# Patient Record
Sex: Female | Born: 1985 | Race: Black or African American | Hispanic: No | Marital: Single | Smoking: Current some day smoker
Health system: Southern US, Community
[De-identification: ages and names within clinical notes are randomized; demographics above are authoritative.]

## PROBLEM LIST (undated history)

## (undated) DIAGNOSIS — F4310 Post-traumatic stress disorder, unspecified: Secondary | ICD-10-CM

## (undated) DIAGNOSIS — J45909 Unspecified asthma, uncomplicated: Secondary | ICD-10-CM

## (undated) DIAGNOSIS — F319 Bipolar disorder, unspecified: Secondary | ICD-10-CM

## (undated) HISTORY — PX: WISDOM TOOTH EXTRACTION: SHX21

---
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
## 2005-04-09 ENCOUNTER — Ambulatory Visit (HOSPITAL_COMMUNITY): Admission: RE | Admit: 2005-04-09 | Discharge: 2005-04-09 | Payer: Self-pay | Admitting: Obstetrics & Gynecology

## 2005-06-25 ENCOUNTER — Ambulatory Visit (HOSPITAL_COMMUNITY): Admission: RE | Admit: 2005-06-25 | Discharge: 2005-06-25 | Payer: Self-pay | Admitting: Obstetrics & Gynecology

## 2005-07-05 ENCOUNTER — Inpatient Hospital Stay (HOSPITAL_COMMUNITY): Admission: AD | Admit: 2005-07-05 | Discharge: 2005-07-05 | Payer: Self-pay | Admitting: Obstetrics & Gynecology

## 2005-07-10 ENCOUNTER — Inpatient Hospital Stay (HOSPITAL_COMMUNITY): Admission: AD | Admit: 2005-07-10 | Discharge: 2005-07-10 | Payer: Self-pay | Admitting: Obstetrics

## 2005-08-25 ENCOUNTER — Inpatient Hospital Stay (HOSPITAL_COMMUNITY): Admission: AD | Admit: 2005-08-25 | Discharge: 2005-08-25 | Payer: Self-pay | Admitting: Obstetrics & Gynecology

## 2005-09-10 ENCOUNTER — Inpatient Hospital Stay (HOSPITAL_COMMUNITY): Admission: RE | Admit: 2005-09-10 | Discharge: 2005-09-13 | Payer: Self-pay | Admitting: Obstetrics

## 2006-05-19 ENCOUNTER — Ambulatory Visit (HOSPITAL_COMMUNITY): Admission: RE | Admit: 2006-05-19 | Discharge: 2006-05-19 | Payer: Self-pay | Admitting: Obstetrics & Gynecology

## 2006-05-26 ENCOUNTER — Inpatient Hospital Stay (HOSPITAL_COMMUNITY): Admission: AD | Admit: 2006-05-26 | Discharge: 2006-05-30 | Payer: Self-pay | Admitting: Obstetrics

## 2006-07-07 ENCOUNTER — Inpatient Hospital Stay (HOSPITAL_COMMUNITY): Admission: AD | Admit: 2006-07-07 | Discharge: 2006-07-07 | Payer: Self-pay | Admitting: Obstetrics & Gynecology

## 2006-07-24 ENCOUNTER — Ambulatory Visit (HOSPITAL_COMMUNITY): Admission: RE | Admit: 2006-07-24 | Discharge: 2006-07-24 | Payer: Self-pay | Admitting: Obstetrics & Gynecology

## 2006-08-27 ENCOUNTER — Inpatient Hospital Stay (HOSPITAL_COMMUNITY): Admission: AD | Admit: 2006-08-27 | Discharge: 2006-08-27 | Payer: Self-pay | Admitting: Obstetrics

## 2006-09-28 ENCOUNTER — Inpatient Hospital Stay (HOSPITAL_COMMUNITY): Admission: AD | Admit: 2006-09-28 | Discharge: 2006-10-01 | Payer: Self-pay | Admitting: Obstetrics

## 2007-04-02 ENCOUNTER — Inpatient Hospital Stay (HOSPITAL_COMMUNITY): Admission: AD | Admit: 2007-04-02 | Discharge: 2007-04-02 | Payer: Self-pay | Admitting: Obstetrics & Gynecology

## 2007-12-12 ENCOUNTER — Emergency Department (HOSPITAL_COMMUNITY): Admission: EM | Admit: 2007-12-12 | Discharge: 2007-12-12 | Payer: Self-pay | Admitting: Family Medicine

## 2008-06-12 ENCOUNTER — Emergency Department (HOSPITAL_COMMUNITY): Admission: EM | Admit: 2008-06-12 | Discharge: 2008-06-12 | Payer: Self-pay | Admitting: Family Medicine

## 2008-06-26 ENCOUNTER — Emergency Department (HOSPITAL_COMMUNITY): Admission: EM | Admit: 2008-06-26 | Discharge: 2008-06-26 | Payer: Self-pay | Admitting: Emergency Medicine

## 2008-10-23 ENCOUNTER — Emergency Department (HOSPITAL_COMMUNITY): Admission: EM | Admit: 2008-10-23 | Discharge: 2008-10-23 | Payer: Self-pay | Admitting: Emergency Medicine

## 2008-11-14 ENCOUNTER — Emergency Department (HOSPITAL_COMMUNITY): Admission: EM | Admit: 2008-11-14 | Discharge: 2008-11-15 | Payer: Self-pay | Admitting: Emergency Medicine

## 2010-03-24 ENCOUNTER — Encounter: Payer: Self-pay | Admitting: Obstetrics & Gynecology

## 2010-06-12 LAB — RPR: RPR Ser Ql: NONREACTIVE

## 2010-06-12 LAB — POCT URINALYSIS DIP (DEVICE)
Glucose, UA: NEGATIVE mg/dL
Ketones, ur: NEGATIVE mg/dL
Specific Gravity, Urine: 1.02 (ref 1.005–1.030)
pH: 7 (ref 5.0–8.0)

## 2010-06-12 LAB — GC/CHLAMYDIA PROBE AMP, URINE: GC Probe Amp, Urine: NEGATIVE

## 2010-06-12 LAB — POCT PREGNANCY, URINE: Preg Test, Ur: NEGATIVE

## 2010-07-19 NOTE — Discharge Summary (Signed)
NAMECYERRA, YIM             ACCOUNT NO.:  [id number]   MEDICAL RECORD NO.:  [id number]          PATIENT TYPE:  INP   LOCATION:  9118                          FACILITY:  [facility]   PHYSICIAN:  Roseanna Rainbow, M.D.DATE OF BIRTH:  01-18-1986   DATE OF ADMISSION:  05/26/2006  DATE OF DISCHARGE:  05/30/2006                               DISCHARGE SUMMARY   CHIEF COMPLAINT:  The patient is a 25 year old African American female  with an intrauterine pregnancy at 21+ weeks complaining of fever,  chills, nausea and vomiting.   HISTORY OF PRESENT ILLNESS:  Please see the above.  She had initially  presented to the [facility] Emergency Department.  A chest x-ray was  negative.   PAST SURGICAL HISTORY:  She denies.   PAST MEDICAL HISTORY:  Asthma.   MEDICATIONS:  Prenatal vitamins, albuterol, Benadryl, and Tylenol.   ALLERGIES:  NO KNOWN DRUG ALLERGIES.   SOCIAL HISTORY:  She is single.  She denies any tobacco, ethanol, or  drug use.   PHYSICAL EXAMINATION:  VITAL SIGNS:  Temperature 102.3.  LUNGS:  Bilateral wheezes.  HEART:  Regular rate and rhythm.  ABDOMEN:  Nontender.  PELVIC:  Deferred.   LABORATORY WORK:  Urinalysis:  Specific gravity greater than 1.030,  positive nitrites, trace leukocyte esterase, moderate ketones.  White  blood cell count 5.7, hemoglobin 11.  Basic metabolic profile normal.   ASSESSMENT:  1. Intrauterine pregnancy at 21+ weeks.  2. Viral upper respiratory tract infection.  3. Dehydration.   PLAN:  Admission parenteral antibiotics, supportive management, check a  urine culture and sensitivity.   HOSPITAL COURSE:  The patient was admitted, parenteral antibiotic  therapy was initiated.  On hospital day #1, she complained of difficulty  swallowing and a sore throat.  An exam on hospital day #2, of the  oropharynx, there was bilateral enlarged tonsils noted.  There was an  exudate noted on the right tonsil.  A phone consultation was obtained  with Dr. Suszanne Conners from ENT, who recommended Decadron and clindamycin.  Her  symptoms improved.  A urine culture and sensitivity with no discrete  uropathogen was noted.  She was then discharged to home on May 30, 2006.   DISCHARGE DIAGNOSIS:  1,  Viral upper respiratory tract infection.  1. Tonsillitis.  2. Intrauterine pregnancy at 21+ weeks.   CONDITION:  Stable.   DIET:  Regular.   ACTIVITY:  Modified bed rest.   MEDICATIONS:  Included clindamycin   DISPOSITION:  She was to call Dr. Suszanne Conners of ENT for an appointment, and  she was to call to follow up in the office in 1 week.      Roseanna Rainbow, M.D.  Electronically Signed     LAJ/MEDQ  D:  07/03/2006  T:  07/03/2006  Job:  [id number]

## 2010-11-21 LAB — URINALYSIS, ROUTINE W REFLEX MICROSCOPIC
Glucose, UA: NEGATIVE
Urobilinogen, UA: 0.2

## 2010-11-21 LAB — POCT PREGNANCY, URINE: Operator id: 280921

## 2010-11-21 LAB — URINE MICROSCOPIC-ADD ON

## 2010-12-16 LAB — CBC
HCT: 30.5 — ABNORMAL LOW
HCT: 36.7
Hemoglobin: 10.1 — ABNORMAL LOW
MCHC: 32.6
MCV: 84.5
MCV: 84.9
RBC: 3.6 — ABNORMAL LOW
RDW: 13.6
RDW: 13.7
WBC: 7
WBC: 9.2

## 2011-08-12 ENCOUNTER — Emergency Department: Payer: Self-pay | Admitting: *Deleted

## 2011-08-12 LAB — URINALYSIS, COMPLETE
Bilirubin,UR: NEGATIVE
Leukocyte Esterase: NEGATIVE
Nitrite: NEGATIVE
Ph: 5 (ref 4.5–8.0)
RBC,UR: 4 /HPF (ref 0–5)
Specific Gravity: 1.03 (ref 1.003–1.030)
Squamous Epithelial: 1

## 2011-08-12 LAB — COMPREHENSIVE METABOLIC PANEL
Albumin: 3.5 g/dL (ref 3.4–5.0)
Alkaline Phosphatase: 58 U/L (ref 50–136)
Anion Gap: 9 (ref 7–16)
BUN: 10 mg/dL (ref 7–18)
Calcium, Total: 8.2 mg/dL — ABNORMAL LOW (ref 8.5–10.1)
Chloride: 107 mmol/L (ref 98–107)
Co2: 22 mmol/L (ref 21–32)
Creatinine: 0.81 mg/dL (ref 0.60–1.30)
EGFR (African American): >60
EGFR (Non-African Amer.): >60
Osmolality: 276 (ref 275–301)
Potassium: 3.4 mmol/L — ABNORMAL LOW (ref 3.5–5.1)
SGOT(AST): 17 U/L (ref 15–37)
SGPT (ALT): 21 U/L
Sodium: 138 mmol/L (ref 136–145)
Total Protein: 7 g/dL (ref 6.4–8.2)

## 2011-08-12 LAB — CBC
HCT: 36.4 % (ref 35.0–47.0)
MCH: 28.1 pg (ref 26.0–34.0)
MCHC: 32.4 g/dL (ref 32.0–36.0)
RBC: 4.2 10*6/uL (ref 3.80–5.20)
WBC: 6.2 10*3/uL (ref 3.6–11.0)

## 2011-08-12 LAB — WET PREP, GENITAL

## 2011-08-12 LAB — HCG, QUANTITATIVE, PREGNANCY: Beta Hcg, Quant.: 1122 m[IU]/mL — ABNORMAL HIGH

## 2011-08-12 LAB — PREGNANCY, URINE: Pregnancy Test, Urine: POSITIVE m[IU]/mL

## 2011-09-05 ENCOUNTER — Emergency Department: Payer: Self-pay | Admitting: *Deleted

## 2011-09-05 LAB — CBC
MCH: 27.9 pg (ref 26.0–34.0)
MCHC: 32.1 g/dL (ref 32.0–36.0)
Platelet: 208 10*3/uL (ref 150–440)
WBC: 4.8 10*3/uL (ref 3.6–11.0)

## 2011-09-05 LAB — URINALYSIS, COMPLETE
Bilirubin,UR: NEGATIVE
Glucose,UR: NEGATIVE mg/dL (ref 0–75)
Nitrite: NEGATIVE
Protein: NEGATIVE
RBC,UR: 5 /HPF (ref 0–5)
Squamous Epithelial: 12
WBC UR: 24 /HPF (ref 0–5)

## 2011-09-05 LAB — HCG, QUANTITATIVE, PREGNANCY: Beta Hcg, Quant.: 4627 m[IU]/mL — ABNORMAL HIGH

## 2012-03-28 ENCOUNTER — Emergency Department: Payer: Self-pay | Admitting: Unknown Physician Specialty

## 2012-07-24 ENCOUNTER — Emergency Department: Payer: Self-pay | Admitting: Emergency Medicine

## 2012-07-24 LAB — URINALYSIS, COMPLETE
Bacteria: NONE SEEN
Glucose,UR: NEGATIVE mg/dL (ref 0–75)
Ph: 7 (ref 4.5–8.0)
Protein: NEGATIVE
Specific Gravity: 1.026 (ref 1.003–1.030)
WBC UR: 7 /HPF (ref 0–5)

## 2012-07-24 LAB — GC/CHLAMYDIA PROBE AMP

## 2013-02-23 ENCOUNTER — Emergency Department (HOSPITAL_COMMUNITY)
Admission: EM | Admit: 2013-02-23 | Discharge: 2013-02-24 | Disposition: A | Discharge status: Home / Self Care | Payer: Self-pay | Attending: Emergency Medicine | Admitting: Emergency Medicine

## 2013-02-23 ENCOUNTER — Encounter (HOSPITAL_COMMUNITY): Payer: Self-pay | Admitting: Emergency Medicine

## 2013-02-23 DIAGNOSIS — N760 Acute vaginitis: Secondary | ICD-10-CM | POA: Insufficient documentation

## 2013-02-23 DIAGNOSIS — B9689 Other specified bacterial agents as the cause of diseases classified elsewhere: Secondary | ICD-10-CM | POA: Insufficient documentation

## 2013-02-23 DIAGNOSIS — Z3202 Encounter for pregnancy test, result negative: Secondary | ICD-10-CM | POA: Insufficient documentation

## 2013-02-23 DIAGNOSIS — A499 Bacterial infection, unspecified: Secondary | ICD-10-CM | POA: Insufficient documentation

## 2013-02-23 DIAGNOSIS — N390 Urinary tract infection, site not specified: Secondary | ICD-10-CM | POA: Insufficient documentation

## 2013-02-23 LAB — URINALYSIS, ROUTINE W REFLEX MICROSCOPIC
Ketones, ur: NEGATIVE mg/dL
Urobilinogen, UA: 1 mg/dL (ref 0.0–1.0)

## 2013-02-23 LAB — URINE MICROSCOPIC-ADD ON

## 2013-02-23 NOTE — ED Notes (Signed)
Pt st;s she has had vaginal discharge with itching x's 2 months.  St's she was seen for same and given antibiotic but did not get it filled  Pt denies any pain

## 2013-02-24 MED ORDER — METRONIDAZOLE 500 MG PO TABS: 500.0000 mg | ORAL_TABLET | Freq: Two times a day (BID) | ORAL | Status: DC

## 2013-02-24 MED ORDER — CEPHALEXIN 500 MG PO CAPS: 500.0000 mg | ORAL_CAPSULE | Freq: Four times a day (QID) | ORAL | Status: DC

## 2013-02-24 NOTE — ED Provider Notes (Signed)
CSN: [id number]     Arrival date & time 02/23/13  2034 History   First MD Initiated Contact with Patient 02/23/13 2350     Chief Complaint  Patient presents with  . Vaginal Discharge   (Consider location/radiation/quality/duration/timing/severity/associated sxs/prior Treatment) HPI Patient is a generally healthy woman in her 89s into says that she was seen in the emergency department 3 weeks ago for vaginal itching with a mucoid vaginal discharge. She was diagnosed with bacterial vaginosis, after pelvic exam. She was prescribed metronidazole. However, she says that in the interim, she lost her job, became homeless and has not had the prescription filled. She requests a second copy of the prescription along with any financial assistance available to her to have her prescription filled.  She continues to have mucoid vaginal discharge with vaginal itching. No abdominal pain, dysuria, fever.  History reviewed. No pertinent past medical history. History reviewed. No pertinent past surgical history. No family history on file. History  Substance Use Topics  . Smoking status: Not on file  . Smokeless tobacco: Not on file  . Alcohol Use: Not on file   OB History   Grav Para Term Preterm Abortions TAB SAB Ect Mult Living                 Review of Systems 10 point ROS performed and is negative with the exception of sx noted above.   Allergies  Review of patient's allergies indicates not on file.  Home Medications  No current outpatient prescriptions on file. BP 106/69  Pulse 95  Temp(Src) 98.4 F (36.9 C) (Oral)  Resp 16  SpO2 98% Physical Exam Gen: well developed and well nourished appearing Head: NCAT Eyes: PERL, EOMI Nose: no epistaixis or rhinorrhea Mouth/throat: mucosa is moist and pink Neck: supple, no stridor Lungs: CTA B, no wheezing, rhonchi or rales CV: RRR, no murmur, extremities appear well perfused Abd: soft, notender, nondistended Back: no ttp, no cva ttp,  normal to inspection Skin: warm and dry Ext: no edema, normal to inspection Neuro: CN ii-xii grossly intact, no focal deficits Psyche; normal affect,  calm and cooperative.   ED Course  Procedures (including critical care time) Labs Review  Results for orders placed during the hospital encounter of 02/23/13 (from the past 24 hour(s))  URINALYSIS, ROUTINE W REFLEX MICROSCOPIC     Status: Abnormal   Collection Time    02/23/13 10:32 PM      Result Value Range   Color, Urine YELLOW  YELLOW   APPearance TURBID (*) CLEAR   Specific Gravity, Urine 1.026  1.005 - 1.030   pH 6.5  5.0 - 8.0   Glucose, UA NEGATIVE  NEGATIVE mg/dL   Hgb urine dipstick MODERATE (*) NEGATIVE   Bilirubin Urine NEGATIVE  NEGATIVE   Ketones, ur NEGATIVE  NEGATIVE mg/dL   Protein, ur 30 (*) NEGATIVE mg/dL   Urobilinogen, UA 1.0  0.0 - 1.0 mg/dL   Nitrite NEGATIVE  NEGATIVE   Leukocytes, UA LARGE (*) NEGATIVE  URINE MICROSCOPIC-ADD ON     Status: Abnormal   Collection Time    02/23/13 10:32 PM      Result Value Range   Squamous Epithelial / LPF MANY (*) RARE   WBC, UA TOO NUMEROUS TO COUNT  <3 WBC/hpf   RBC / HPF 21-50  <3 RBC/hpf   Bacteria, UA MANY (*) RARE   Urine-Other MODERATE TRICHOMONAS    POCT PREGNANCY, URINE     Status: None   Collection Time  02/23/13 10:38 PM      Result Value Range   Preg Test, Ur NEGATIVE  NEGATIVE     MDM  Patient with untreated bacterial vaginosis. We will give script for Flagyl which is on $4 list at [facility]. Will see if case management can be of assistance with cost. The patient has an incidental finding of UTI with contaminated specimen. We will tx with Keflex po.  Patient referred to [facility] for outpatient f/u.     Brandt Loosen, MD 02/24/13 [phone]

## 2013-03-21 ENCOUNTER — Encounter (HOSPITAL_COMMUNITY): Payer: Self-pay | Admitting: Emergency Medicine

## 2013-03-21 ENCOUNTER — Emergency Department (HOSPITAL_COMMUNITY)
Admission: EM | Admit: 2013-03-21 | Discharge: 2013-03-22 | Disposition: A | Discharge status: Home / Self Care | Payer: Self-pay | Attending: Emergency Medicine | Admitting: Emergency Medicine

## 2013-03-21 DIAGNOSIS — R102 Pelvic and perineal pain: Secondary | ICD-10-CM

## 2013-03-21 DIAGNOSIS — F17200 Nicotine dependence, unspecified, uncomplicated: Secondary | ICD-10-CM | POA: Insufficient documentation

## 2013-03-21 DIAGNOSIS — Z79899 Other long term (current) drug therapy: Secondary | ICD-10-CM | POA: Insufficient documentation

## 2013-03-21 DIAGNOSIS — F319 Bipolar disorder, unspecified: Secondary | ICD-10-CM | POA: Insufficient documentation

## 2013-03-21 DIAGNOSIS — J45909 Unspecified asthma, uncomplicated: Secondary | ICD-10-CM | POA: Insufficient documentation

## 2013-03-21 DIAGNOSIS — R109 Unspecified abdominal pain: Secondary | ICD-10-CM | POA: Insufficient documentation

## 2013-03-21 DIAGNOSIS — F4310 Post-traumatic stress disorder, unspecified: Secondary | ICD-10-CM | POA: Insufficient documentation

## 2013-03-21 DIAGNOSIS — Z882 Allergy status to sulfonamides status: Secondary | ICD-10-CM | POA: Insufficient documentation

## 2013-03-21 DIAGNOSIS — R110 Nausea: Secondary | ICD-10-CM | POA: Insufficient documentation

## 2013-03-21 DIAGNOSIS — N72 Inflammatory disease of cervix uteri: Secondary | ICD-10-CM | POA: Insufficient documentation

## 2013-03-21 DIAGNOSIS — Z8619 Personal history of other infectious and parasitic diseases: Secondary | ICD-10-CM | POA: Insufficient documentation

## 2013-03-21 DIAGNOSIS — Z3202 Encounter for pregnancy test, result negative: Secondary | ICD-10-CM | POA: Insufficient documentation

## 2013-03-21 HISTORY — DX: Post-traumatic stress disorder, unspecified: F43.10

## 2013-03-21 HISTORY — DX: Unspecified asthma, uncomplicated: J45.909

## 2013-03-21 HISTORY — DX: Bipolar disorder, unspecified: F31.9

## 2013-03-21 LAB — CBC WITH DIFFERENTIAL/PLATELET
BASOS ABS: 0 10*3/uL (ref 0.0–0.1)
BASOS PCT: 0 % (ref 0–1)
EOS ABS: 0.3 10*3/uL (ref 0.0–0.7)
Eosinophils Relative: 5 % (ref 0–5)
HCT: 39.4 % (ref 36.0–46.0)
Hemoglobin: 12.9 g/dL (ref 12.0–15.0)
Lymphocytes Relative: 44 % (ref 12–46)
Lymphs Abs: 2.4 10*3/uL (ref 0.7–4.0)
MCH: 28.1 pg (ref 26.0–34.0)
MCHC: 32.7 g/dL (ref 30.0–36.0)
MCV: 85.8 fL (ref 78.0–100.0)
MONOS PCT: 8 % (ref 3–12)
Monocytes Absolute: 0.4 10*3/uL (ref 0.1–1.0)
NEUTROS ABS: 2.4 10*3/uL (ref 1.7–7.7)
Neutrophils Relative %: 43 % (ref 43–77)
PLATELETS: 233 10*3/uL (ref 150–400)
RBC: 4.59 MIL/uL (ref 3.87–5.11)
RDW: 13.5 % (ref 11.5–15.5)
WBC: 5.5 10*3/uL (ref 4.0–10.5)

## 2013-03-21 LAB — COMPREHENSIVE METABOLIC PANEL
ALBUMIN: 3.5 g/dL (ref 3.5–5.2)
ALK PHOS: 54 U/L (ref 39–117)
ALT: 19 U/L (ref 0–35)
AST: 18 U/L (ref 0–37)
BILIRUBIN TOTAL: 0.2 mg/dL — AB (ref 0.3–1.2)
BUN: 10 mg/dL (ref 6–23)
CHLORIDE: 107 meq/L (ref 96–112)
CO2: 23 mEq/L (ref 19–32)
Calcium: 8.7 mg/dL (ref 8.4–10.5)
Creatinine, Ser: 0.58 mg/dL (ref 0.50–1.10)
GFR calc Af Amer: >90 mL/min (ref >90)
GFR calc non Af Amer: >90 mL/min (ref >90)
Glucose, Bld: 101 mg/dL — ABNORMAL HIGH (ref 70–99)
POTASSIUM: 4 meq/L (ref 3.7–5.3)
Sodium: 141 mEq/L (ref 137–147)
TOTAL PROTEIN: 7 g/dL (ref 6.0–8.3)

## 2013-03-21 LAB — LIPASE, BLOOD: LIPASE: 43 U/L (ref 11–59)

## 2013-03-21 LAB — POCT PREGNANCY, URINE: Preg Test, Ur: NEGATIVE

## 2013-03-21 NOTE — ED Notes (Signed)
Patients states unable to void at this time.  Patient is aware of need for UA.

## 2013-03-21 NOTE — ED Notes (Signed)
Pt in via EMS to triage, c/o right flank pain and lower right abd pain x2 days, worse today, nausea with diarrhea, unknown if she is pregnant

## 2013-03-21 NOTE — ED Notes (Signed)
Pt with bil lower back pain that radiates to R and L lower abdomen.  Denies urinary s/s or vaginal d/c but c/o nausea and diarrhea.

## 2013-03-22 LAB — URINALYSIS, ROUTINE W REFLEX MICROSCOPIC
BILIRUBIN URINE: NEGATIVE
GLUCOSE, UA: NEGATIVE mg/dL
Ketones, ur: NEGATIVE mg/dL
Nitrite: NEGATIVE
PH: 5.5 (ref 5.0–8.0)
Protein, ur: NEGATIVE mg/dL
SPECIFIC GRAVITY, URINE: 1.021 (ref 1.005–1.030)
Urobilinogen, UA: 0.2 mg/dL (ref 0.0–1.0)

## 2013-03-22 LAB — WET PREP, GENITAL
CLUE CELLS WET PREP: NONE SEEN
TRICH WET PREP: NONE SEEN
YEAST WET PREP: NONE SEEN

## 2013-03-22 LAB — GC/CHLAMYDIA PROBE AMP
CT Probe RNA: NEGATIVE
GC Probe RNA: NEGATIVE

## 2013-03-22 LAB — URINE MICROSCOPIC-ADD ON

## 2013-03-22 MED ORDER — AZITHROMYCIN 1 G PO PACK
1.0000 g | PACK | Freq: Once | ORAL | Status: AC
  Administered 2013-03-22: 1 g via ORAL
  Filled 2013-03-22: qty 1

## 2013-03-22 MED ORDER — CEFTRIAXONE SODIUM 250 MG IJ SOLR
250.0000 mg | Freq: Once | INTRAMUSCULAR | Status: AC
  Administered 2013-03-22: 250 mg via INTRAMUSCULAR
  Filled 2013-03-22: qty 250

## 2013-03-22 MED ORDER — ACETAMINOPHEN 500 MG PO TABS
1000.0000 mg | ORAL_TABLET | Freq: Once | ORAL | Status: AC
  Administered 2013-03-22: 1000 mg via ORAL
  Filled 2013-03-22: qty 2

## 2013-03-22 MED ORDER — LIDOCAINE HCL (PF) 1 % IJ SOLN
INTRAMUSCULAR | Status: AC
  Administered 2013-03-22: 2.1 mL
  Filled 2013-03-22: qty 5

## 2013-03-22 NOTE — ED Provider Notes (Signed)
CSN: [id number]     Arrival date & time 03/21/13  1906 History   First MD Initiated Contact with Patient 03/22/13 0025     Chief Complaint  Patient presents with  . Back Pain  . Abdominal Pain   (Consider location/radiation/quality/duration/timing/severity/associated sxs/prior Treatment) Patient is a 28 y.o. female presenting with back pain and abdominal pain.  Back Pain Location:  Lumbar spine (now spread to suprapubic abdominal pain) Quality:  Aching Radiates to:  Does not radiate Pain severity:  Moderate Onset quality:  Gradual Duration:  3 days Timing:  Constant Progression:  Worsening Chronicity:  New Relieved by:  Nothing Worsened by:  Nothing tried Associated symptoms: abdominal pain   Associated symptoms: no chest pain, no dysuria and no fever   Associated symptoms comment:  No constipation, no diarrhea Abdominal Pain Pain location:  Suprapubic Associated symptoms: nausea   Associated symptoms: no chest pain, no cough, no diarrhea, no dysuria, no fever, no shortness of breath, no vaginal bleeding, no vaginal discharge and no vomiting     Past Medical History  Diagnosis Date  . Asthma   . PTSD (post-traumatic stress disorder)   . Bipolar 1 disorder    History reviewed. No pertinent past surgical history. No family history on file. History  Substance Use Topics  . Smoking status: Current Some Day Smoker    Types: Cigarettes  . Smokeless tobacco: Not on file  . Alcohol Use: No   OB History   Grav Para Term Preterm Abortions TAB SAB Ect Mult Living                 Review of Systems  Constitutional: Negative for fever.  HENT: Negative for congestion.   Respiratory: Negative for cough and shortness of breath.   Cardiovascular: Negative for chest pain.  Gastrointestinal: Positive for nausea and abdominal pain. Negative for vomiting and diarrhea.  Genitourinary: Negative for dysuria, vaginal bleeding and vaginal discharge.  Musculoskeletal: Positive for back  pain.  All other systems reviewed and are negative.    Allergies  Sulfa antibiotics  Home Medications   Current Outpatient Rx  Name  Route  Sig  Dispense  Refill  . escitalopram (LEXAPRO) 10 MG tablet   Oral   Take 10 mg by mouth daily.         Marland Kitchen. ibuprofen (ADVIL,MOTRIN) 200 MG tablet   Oral   Take 200 mg by mouth every 6 (six) hours as needed.         . lurasidone (LATUDA) 40 MG TABS tablet   Oral   Take 40 mg by mouth daily with breakfast.         . cephALEXin (KEFLEX) 500 MG capsule   Oral   Take 1 capsule (500 mg total) by mouth 4 (four) times daily.   28 capsule   0   . metroNIDAZOLE (FLAGYL) 500 MG tablet   Oral   Take 1 tablet (500 mg total) by mouth 2 (two) times daily. One po bid x 7 days   14 tablet   0    BP 117/52  Pulse 87  Temp(Src) 98.1 F (36.7 C) (Oral)  Resp 16  Ht 5' 10.5" (1.791 m)  Wt 315 lb 4.1 oz (143 kg)  BMI 44.58 kg/m2  SpO2 100%  LMP 02/23/2013 Physical Exam  Nursing note and vitals reviewed. Constitutional: She is oriented to person, place, and time. She appears well-developed and well-nourished. No distress.  obese  HENT:  Head: Normocephalic and atraumatic.  Mouth/Throat: Oropharynx is clear and moist.  Eyes: Conjunctivae are normal. Pupils are equal, round, and reactive to light. No scleral icterus.  Neck: Neck supple.  Cardiovascular: Normal rate, regular rhythm, normal heart sounds and intact distal pulses.   No murmur heard. Pulmonary/Chest: Effort normal and breath sounds normal. No stridor. No respiratory distress. She has no rales.  Abdominal: Soft. Bowel sounds are normal. She exhibits no distension. There is tenderness in the suprapubic area. There is no rigidity, no rebound, no guarding and no CVA tenderness.  Genitourinary: Cervix exhibits discharge (white) and friability. Cervix exhibits no motion tenderness. Right adnexum displays no mass. Left adnexum displays no mass.  Mild nonfocal pelvic tenderness   Musculoskeletal: Normal range of motion.       Lumbar back: She exhibits no tenderness.  Neurological: She is alert and oriented to person, place, and time.  Skin: Skin is warm and dry. No rash noted.  Psychiatric: She has a normal mood and affect. Her behavior is normal.    ED Course  Procedures (including critical care time) Labs Review Labs Reviewed  WET PREP, GENITAL - Abnormal; Notable for the following:    WBC, Wet Prep HPF POC MANY (*)    All other components within normal limits  COMPREHENSIVE METABOLIC PANEL - Abnormal; Notable for the following:    Glucose, Bld 101 (*)    Total Bilirubin 0.2 (*)    All other components within normal limits  URINALYSIS, ROUTINE W REFLEX MICROSCOPIC - Abnormal; Notable for the following:    APPearance CLOUDY (*)    Hgb urine dipstick TRACE (*)    Leukocytes, UA SMALL (*)    All other components within normal limits  URINE MICROSCOPIC-ADD ON - Abnormal; Notable for the following:    Squamous Epithelial / LPF MANY (*)    Bacteria, UA FEW (*)    All other components within normal limits  URINE CULTURE  GC/CHLAMYDIA PROBE AMP  CBC WITH DIFFERENTIAL  LIPASE, BLOOD  POCT PREGNANCY, URINE   Imaging Review No results found.  EKG Interpretation   None       MDM   1. Suprapubic abdominal pain   2. Cervicitis    28 yo female with low back pain, now suprapubic abd pain.  No urinary symptoms.  No vaginal bleeding or discharge.  Recently finished course of Flagyl for BV.  Urine without evidence of infection.  Trace blood, but symptoms inconsistent with urinary stone.  Labwork otherwise unremarkable.  Plan pelvic exam.    Pelvic notable for cervical friability and mild tenderness.  Exam not c/w torsion, TOA, appendicitis, bowel obstruction.  Plan to treat for cervicitis presumptively.  Pt has hx of STD, as well.  Rocephin IM and azithro ordered.    Candyce Churn, MD 03/22/13 [phone]

## 2013-03-22 NOTE — Discharge Instructions (Signed)
Abdominal Pain, Women °Abdominal (stomach, pelvic, or belly) pain can be caused by many things. It is important to tell your doctor: °· The location of the pain. °· Does it come and go or is it present all the time? °· Are there things that start the pain (eating certain foods, exercise)? °· Are there other symptoms associated with the pain (fever, nausea, vomiting, diarrhea)? °All of this is helpful to know when trying to find the cause of the pain. °CAUSES  °· Stomach: virus or bacteria infection, or ulcer. °· Intestine: appendicitis (inflamed appendix), regional ileitis (Crohn's disease), ulcerative colitis (inflamed colon), irritable bowel syndrome, diverticulitis (inflamed diverticulum of the colon), or cancer of the stomach or intestine. °· Gallbladder disease or stones in the gallbladder. °· Kidney disease, kidney stones, or infection. °· Pancreas infection or cancer. °· Fibromyalgia (pain disorder). °· Diseases of the female organs: °· Uterus: fibroid (non-cancerous) tumors or infection. °· Fallopian tubes: infection or tubal pregnancy. °· Ovary: cysts or tumors. °· Pelvic adhesions (scar tissue). °· Endometriosis (uterus lining tissue growing in the pelvis and on the pelvic organs). °· Pelvic congestion syndrome (female organs filling up with blood just before the menstrual period). °· Pain with the menstrual period. °· Pain with ovulation (producing an egg). °· Pain with an IUD (intrauterine device, birth control) in the uterus. °· Cancer of the female organs. °· Functional pain (pain not caused by a disease, may improve without treatment). °· Psychological pain. °· Depression. °DIAGNOSIS  °Your doctor will decide the seriousness of your pain by doing an examination. °· Blood tests. °· X-rays. °· Ultrasound. °· CT scan (computed tomography, special type of X-ray). °· MRI (magnetic resonance imaging). °· Cultures, for infection. °· Barium enema (dye inserted in the large intestine, to better view it with  X-rays). °· Colonoscopy (looking in intestine with a lighted tube). °· Laparoscopy (minor surgery, looking in abdomen with a lighted tube). °· Major abdominal exploratory surgery (looking in abdomen with a large incision). °TREATMENT  °The treatment will depend on the cause of the pain.  °· Many cases can be observed and treated at home. °· Over-the-counter medicines recommended by your caregiver. °· Prescription medicine. °· Antibiotics, for infection. °· Birth control pills, for painful periods or for ovulation pain. °· Hormone treatment, for endometriosis. °· Nerve blocking injections. °· Physical therapy. °· Antidepressants. °· Counseling with a psychologist or psychiatrist. °· Minor or major surgery. °HOME CARE INSTRUCTIONS  °· Do not take laxatives, unless directed by your caregiver. °· Take over-the-counter pain medicine only if ordered by your caregiver. Do not take aspirin because it can cause an upset stomach or bleeding. °· Try a clear liquid diet (broth or water) as ordered by your caregiver. Slowly move to a bland diet, as tolerated, if the pain is related to the stomach or intestine. °· Have a thermometer and take your temperature several times a day, and record it. °· Bed rest and sleep, if it helps the pain. °· Avoid sexual intercourse, if it causes pain. °· Avoid stressful situations. °· Keep your follow-up appointments and tests, as your caregiver orders. °· If the pain does not go away with medicine or surgery, you may try: °· Acupuncture. °· Relaxation exercises (yoga, meditation). °· Group therapy. °· Counseling. °SEEK MEDICAL CARE IF:  °· You notice certain foods cause stomach pain. °· Your home care treatment is not helping your pain. °· You need stronger pain medicine. °· You want your IUD removed. °· You feel faint or   lightheaded.  You develop nausea and vomiting.  You develop a rash.  You are having side effects or an allergy to your medicine. SEEK IMMEDIATE MEDICAL CARE IF:   Your  pain does not go away or gets worse.  You have a fever.  Your pain is felt only in portions of the abdomen. The right side could possibly be appendicitis. The left lower portion of the abdomen could be colitis or diverticulitis.  You are passing blood in your stools (bright red or black tarry stools, with or without vomiting).  You have blood in your urine.  You develop chills, with or without a fever.  You pass out. MAKE SURE YOU:   Understand these instructions.  Will watch your condition.  Will get help right away if you are not doing well or get worse. Document Released: 12/15/2006 Document Revised: 05/12/2011 Document Reviewed: 01/04/2009 [facility] Patient Information 2014 [place], [place].  Cervicitis Cervicitis is a soreness and swelling (inflammation) of the cervix. Your cervix is located at the bottom of your uterus. It opens up to the vagina. CAUSES   Sexually transmitted infections (STIs).   Allergic reaction.   Medicines or birth control devices that are put in the vagina.   Injury to the cervix.   Bacterial infections.  RISK FACTORS You are at greater risk if you:  Have unprotected sexual intercourse.  Have sexual intercourse with many partners.  Began sexual intercourse at an early age.  Have a history of STIs. SYMPTOMS  There may be no symptoms. If symptoms occur, they may include:   Grey, white, yellow, or bad-smelling vaginal discharge.   Pain or itching of the area outside the vagina.   Painful sexual intercourse.   Lower abdominal or lower back pain, especially during intercourse.   Frequent urination.   Abnormal vaginal bleeding between periods, after sexual intercourse, or after menopause.   Pressure or a heavy feeling in the pelvis.  DIAGNOSIS  Diagnosis is made after a pelvic exam. Other tests may include:   Examination of any discharge under a microscope (wet prep).   A Pap test.  TREATMENT  Treatment will depend  on the cause of cervicitis. If it is caused by an STI, both you and your partner will need to be treated. Antibiotic medicines will be given.  HOME CARE INSTRUCTIONS   Do not have sexual intercourse until your health care provider says it is okay.   Do not have sexual intercourse until your partner has been treated, if your cervicitis is caused by an STI.   Take your antibiotics as directed. Finish them even if you start to feel better.  SEEK MEDICAL CARE IF:  Your symptoms come back.   You have a fever.  MAKE SURE YOU:   Understand these instructions.  Will watch your condition.  Will get help right away if you are not doing well or get worse. Document Released: 02/17/2005 Document Revised: 10/20/2012 Document Reviewed: 08/11/2012 [facility] Patient Information 2014 [place], [place].   Emergency Department Resource Guide 1) Find a Doctor and Pay Out of Pocket Although you won't have to find out who is covered by your insurance plan, it is a good idea to ask around and get recommendations. You will then need to call the office and see if the doctor you have chosen will accept you as a new patient and what types of options they offer for patients who are self-pay. Some doctors offer discounts or will set up payment plans for their patients  who do not have insurance, but you will need to ask so you aren't surprised when you get to your appointment.  2) Contact Your Local Health Department Not all health departments have doctors that can see patients for sick visits, but many do, so it is worth a call to see if yours does. If you don't know where your local health department is, you can check in your phone book. The CDC also has a tool to help you locate your state's health department, and many state websites also have listings of all of their local health departments.  3) Find a Walk-in Clinic If your illness is not likely to be very severe or complicated, you may want to try a walk  in clinic. These are popping up all over the country in pharmacies, drugstores, and shopping centers. They're usually staffed by nurse practitioners or physician assistants that have been trained to treat common illnesses and complaints. They're usually fairly quick and inexpensive. However, if you have serious medical issues or chronic medical problems, these are probably not your best option.  No Primary Care Doctor: - Call Health Connect at  [phone] - they can help you locate a primary care doctor that  accepts your insurance, provides certain services, etc. - Physician Referral Service- [phone]  Chronic Pain Problems: Organization         Address  Phone   Notes  [facility]  [phone] Patients need to be referred by their primary care doctor.   Medication Assistance: Organization         Address  Phone   Notes  [facility] Medication [facility] [address] [place] [place], [place] [address] [phone] --Must be a resident of [facility] -- Must have NO insurance coverage whatsoever (no Medicaid/ Medicare, etc.) -- The pt. MUST have a primary care doctor that directs their care regularly and follows them in the community   [facility]  [phone]   [employer]  [phone]    Agencies that provide inexpensive medical care: Organization         Address  Phone   Notes  [facility]  [phone]   [facility]    [phone]   [facility] [address] [place], [place] [address] [phone]   [facility] [address] [place] [place] [phone]   Planned Parenthood    [phone]   [facility]    [phone]   [facility]  [address], [place] Phone:  [phone], Fax:  [phone] Hours of Operation:  9 am - 6 pm, M-F.  Also accepts Medicaid/Medicare and self-pay.  [facility]  [address], [place] Phone: [phone], Fax: [phone]. Hours of Operation:  8:30 am - 5:30 pm, M-F.  Also accepts Medicaid and self-pay.  [facility] [place] [address] [place] Phone: [phone]   [facility] [address] Natasha Bence [place], [place] [phone], Ext. [phone] Mondays & Thursdays: 7-9 AM.  First 15 patients are seen on a first come, first serve basis.    Medicaid-accepting [employer] [facility] Providers:  Organization         Address  Phone   Notes  [facility] [address], [place] [phone] Also accepts self-pay patients.  [facility] [address] Laurell Josephs [place], [place]  [phone]   [facility] [address], [place] [phone]   [facility] Family Medicine [address], [place] [phone]   Renaye Rakers [address], [place]   [phone] Only accepts [place] patients after they have their name applied to their card.   Self-Pay (no insurance) in [facility]:  Organization         Address  Phone   Notes  Sickle Cell Patients, [facility] [address] [place], [place] [phone]   [facility] [address] [place], [place] [phone]   [facility] [place]  [address], [place] [phone]   [facility]/Dr. Osei-Bonsu  [address] [place] or [address], [place] [phone] Phone number for both [place] and [place] locations is the same.  [facility]gent Medical and [facility] [address] [place] [phone]   [facility] [address] [place] or [address] [phone]   [facility] [address], [place] [phone], phone; [phone], fax Sees  patients 1st and 3rd Saturday of every month.  Must not qualify for public or private insurance (i.e. Medicaid, Medicare, [place] Health Choice, Veterans' Benefits)  Household income should be no more than 200% of the poverty level The clinic cannot treat you if you are pregnant or think you are pregnant  Sexually transmitted diseases are not treated at the clinic.    Dental Care: Organization         Address  Phone  Notes  [facility] Department of [facility] [address] [place], [place] [phone] Accepts children up to age 81 who are enrolled in [place] or [place] Health Choice; pregnant women with a Medicaid card; and children who have applied for Medicaid or [place] Health Choice, but were declined, whose parents can pay a reduced fee at time of service.  [facility] Department of [facility]  [address], [place] [phone] Accepts children up to age 5 who are enrolled in [place] or [place] Health Choice; pregnant women with a Medicaid card; and children who have applied for Medicaid or [place] Health Choice, but were declined, whose parents can pay a reduced fee at time of service.  [facility]  [address] [place], [place] [phone] Patients are seen by appointment only. Walk-ins are not accepted. [facility] will see patients 31 years of age and older. Monday - Tuesday (8am-5pm) Most Wednesdays (8:30-5pm) $30 per visit, cash only  [facility] Adult Dental Access PROGRAM  [address], [facility] [phone] Patients are seen by appointment only. Walk-ins are not accepted. [facility] will see patients 3 years of age and older. One Wednesday Evening (Monthly: Volunteer Based).  $30 per visit, cash only  [employer]  [phone] for adults; Children under age 37, call Graduate Pediatric Dentistry at [phone]. Children aged 66-14, please call [phone] to request a  pediatric application.  Dental services are provided in all areas of dental care including fillings, crowns and bridges, complete and partial dentures, implants, gum treatment, root canals, and extractions. Preventive care is also provided. Treatment is provided to both adults and children.  Patients are selected via a lottery and there is often a waiting list.   [facility] [address], [place]  [phone] [email or web address]   [facility] [address], [place], [place] [phone], Ext. [phone] Second and Fourth Thursday of each month, opens at 6:30 AM; Clinic ends at 9 AM.  Patients are seen on a first-come first-served basis, and a limited number are seen during each clinic.   [facility]  [address], [place], [place] [phone]   Eligibility Requirements You must have lived in [place], [place], or [place] for at least the last three months.   You cannot be eligible for state or federal sponsored [employer] Cityhealthcare insurance, including CIG[employer]eterans Administration, [place]icaid, or [employer]icare.   You generally cannot be eligible for healthcare insurance through your employer.    How to apply: Eligibility screenings are held every Tuesday and Wednesday afternoon from 1:00 pm until 4:00 pm. You do not need an appointment for the interview!  [facility] [address], [place], [place] [phone]   [facility]  [phone]   [facility]  [phone]   [facility]  [phone]    Behavioral Health Resources in the Community: Intensive Outpatient Programs Organization         Address  Phone  Notes  [facility] [address], [place], [place] [phone]   [facility] [address], [place], [place] [phone]   [facility] [address], [place], [place]  [phone]   [facility] [address],  [place], [place] [phone] or [phone]   Substance Abuse Resources Organization         Address  Phone  Notes  [facility]  [phone]   [facility]  [phone]   [facility]  [phone]   [facility]  [phone]   Residential & Outpatient Substance Abuse Program  [phone]   Psychological Services Organization         Address  Phone  Notes  [facility]  [phone]- [phone]   [facility]  [phone]- [phone]   [facility] [address], [place] [phone] or [phone]    Mobile Crisis Teams Organization         Address  Phone  Notes  [facility], Mobile Crisis Care Unit  [phone]   [facility]  [address] [place], [place] [phone]   Doristine LocksSharon DeEsch [address] [place] [phone]    Self-Help/Support Groups Organization         Address  Phone             Notes  [facility] [place] - variety of support groups  [phone] Call for more information  Narcotics Anonymous (NA), [facility] [address], [place]  2 meetings at this location   Statisticianesidential Treatment Programs Organization         Address  Phone  Notes  [facility] [address],    [place]  [phone]   [facility]  [address], [place] [phone], [place], [place] [phone]   [facility] [address], [place] [phone] Admissions: 8am-3pm M-F  [facility] [address]    [place], [place] [phone]   [facility] [phone]  [address] Leonard Schwartz [place], [place] [phone]   [facility] [address]  [place], [place] [phone]   [facility] - Intensive Outpatient [address] Laurell Josephs [address], [place], [place] [phone]   [facility] ([facility] [address] [place]    [place], [place] [phone] or [phone]   [facility] ([facility]) [address], [place], [place] [phone] Accepts Medicaid  [facility] [address],  [place] [phone] Substance Abuse/Addiction Treatment   [facility] Organization         Address  Phone  Notes  [facility]  [phone]   Angie Fava, PhD [address] Ervin Knack [place], [place]   [phone] or [phone]   [facility] Behavioral   [address] [place], [place] [phone]   [facility] [address] [place], [place] [phone] Insurance/Medicaid/sponsorship through [facility] and Families [address]                                    [employer], [place] [phone] Therapy/tele-psych/case  [facility] [address], [place] [phone]    Dr. Lolly Mustache  [phone]   [facility]  [employer] [facility] Dept. 1) [address] [place] 2) [address] [place] 3)  [address], [place] [phone]  [phone]   [facility] Child Abuse Hotline (402)067-0014 or [phone] (After Hours)

## 2013-03-23 LAB — URINE CULTURE: Colony Count: 75000

## 2015-08-09 ENCOUNTER — Emergency Department: Payer: Self-pay

## 2015-08-09 ENCOUNTER — Emergency Department
Admission: EM | Admit: 2015-08-09 | Discharge: 2015-08-09 | Disposition: A | Discharge status: Home / Self Care | Payer: Self-pay | Attending: Emergency Medicine | Admitting: Emergency Medicine

## 2015-08-09 ENCOUNTER — Encounter: Payer: Self-pay | Admitting: *Deleted

## 2015-08-09 DIAGNOSIS — J45909 Unspecified asthma, uncomplicated: Secondary | ICD-10-CM | POA: Insufficient documentation

## 2015-08-09 DIAGNOSIS — N83202 Unspecified ovarian cyst, left side: Secondary | ICD-10-CM

## 2015-08-09 DIAGNOSIS — N3090 Cystitis, unspecified without hematuria: Secondary | ICD-10-CM

## 2015-08-09 DIAGNOSIS — F319 Bipolar disorder, unspecified: Secondary | ICD-10-CM | POA: Insufficient documentation

## 2015-08-09 DIAGNOSIS — Z791 Long term (current) use of non-steroidal anti-inflammatories (NSAID): Secondary | ICD-10-CM | POA: Insufficient documentation

## 2015-08-09 DIAGNOSIS — F17210 Nicotine dependence, cigarettes, uncomplicated: Secondary | ICD-10-CM | POA: Insufficient documentation

## 2015-08-09 DIAGNOSIS — R112 Nausea with vomiting, unspecified: Secondary | ICD-10-CM | POA: Insufficient documentation

## 2015-08-09 LAB — URINALYSIS COMPLETE WITH MICROSCOPIC (ARMC ONLY)
BILIRUBIN URINE: NEGATIVE
GLUCOSE, UA: NEGATIVE mg/dL
KETONES UR: NEGATIVE mg/dL
LEUKOCYTES UA: NEGATIVE
NITRITE: NEGATIVE
Protein, ur: NEGATIVE mg/dL
SPECIFIC GRAVITY, URINE: 1.021 (ref 1.005–1.030)
pH: 6 (ref 5.0–8.0)

## 2015-08-09 LAB — WET PREP, GENITAL
Clue Cells Wet Prep HPF POC: NONE SEEN
Sperm: NONE SEEN
TRICH WET PREP: NONE SEEN
Yeast Wet Prep HPF POC: NONE SEEN

## 2015-08-09 LAB — CBC WITH DIFFERENTIAL/PLATELET
BASOS ABS: 0 10*3/uL (ref 0–0.1)
BASOS PCT: 1 %
EOS ABS: 0.2 10*3/uL (ref 0–0.7)
EOS PCT: 3 %
HCT: 37.9 % (ref 35.0–47.0)
Hemoglobin: 12.3 g/dL (ref 12.0–16.0)
LYMPHS PCT: 35 %
Lymphs Abs: 1.9 10*3/uL (ref 1.0–3.6)
MCH: 28.2 pg (ref 26.0–34.0)
MCHC: 32.6 g/dL (ref 32.0–36.0)
MCV: 86.6 fL (ref 80.0–100.0)
MONO ABS: 0.5 10*3/uL (ref 0.2–0.9)
Monocytes Relative: 8 %
Neutro Abs: 3 10*3/uL (ref 1.4–6.5)
Neutrophils Relative %: 53 %
PLATELETS: 196 10*3/uL (ref 150–440)
RBC: 4.37 MIL/uL (ref 3.80–5.20)
RDW: 13.4 % (ref 11.5–14.5)
WBC: 5.7 10*3/uL (ref 3.6–11.0)

## 2015-08-09 LAB — COMPREHENSIVE METABOLIC PANEL
ALBUMIN: 3.9 g/dL (ref 3.5–5.0)
ALK PHOS: 45 U/L (ref 38–126)
ALT: 13 U/L — ABNORMAL LOW (ref 14–54)
ANION GAP: 7 (ref 5–15)
AST: 15 U/L (ref 15–41)
BUN: 9 mg/dL (ref 6–20)
CALCIUM: 8.8 mg/dL — AB (ref 8.9–10.3)
CHLORIDE: 108 mmol/L (ref 101–111)
CO2: 21 mmol/L — AB (ref 22–32)
Creatinine, Ser: 0.61 mg/dL (ref 0.44–1.00)
GFR calc non Af Amer: >60 mL/min (ref >60)
Glucose, Bld: 88 mg/dL (ref 65–99)
Potassium: 4.3 mmol/L (ref 3.5–5.1)
Sodium: 136 mmol/L (ref 135–145)
Total Bilirubin: 0.2 mg/dL — ABNORMAL LOW (ref 0.3–1.2)
Total Protein: 6.4 g/dL — ABNORMAL LOW (ref 6.5–8.1)

## 2015-08-09 LAB — LIPASE, BLOOD: Lipase: 25 U/L (ref 11–51)

## 2015-08-09 LAB — POCT PREGNANCY, URINE: Preg Test, Ur: NEGATIVE

## 2015-08-09 LAB — CHLAMYDIA/NGC RT PCR (ARMC ONLY)
CHLAMYDIA TR: NOT DETECTED
N gonorrhoeae: NOT DETECTED

## 2015-08-09 LAB — PREGNANCY, URINE: Preg Test, Ur: NEGATIVE

## 2015-08-09 MED ORDER — IBUPROFEN 800 MG PO TABS
800.0000 mg | ORAL_TABLET | Freq: Once | ORAL | Status: AC
  Administered 2015-08-09: 800 mg via ORAL

## 2015-08-09 MED ORDER — IBUPROFEN 800 MG PO TABS: 800.0000 mg | ORAL_TABLET | Freq: Three times a day (TID) | ORAL | Status: DC | PRN

## 2015-08-09 MED ORDER — IBUPROFEN 800 MG PO TABS
ORAL_TABLET | ORAL | Status: AC
  Administered 2015-08-09: 800 mg via ORAL
  Filled 2015-08-09: qty 1

## 2015-08-09 MED ORDER — DIATRIZOATE MEGLUMINE & SODIUM 66-10 % PO SOLN
15.0000 mL | Freq: Once | ORAL | Status: AC
  Administered 2015-08-09: 15 mL via ORAL

## 2015-08-09 MED ORDER — CIPROFLOXACIN HCL 250 MG PO TABS: 250.0000 mg | ORAL_TABLET | Freq: Two times a day (BID) | ORAL | Status: DC

## 2015-08-09 MED ORDER — MORPHINE SULFATE (PF) 4 MG/ML IV SOLN
4.0000 mg | Freq: Once | INTRAVENOUS | Status: AC
  Administered 2015-08-09: 4 mg via INTRAVENOUS
  Filled 2015-08-09: qty 1

## 2015-08-09 MED ORDER — IOPAMIDOL (ISOVUE-300) INJECTION 61%
125.0000 mL | Freq: Once | INTRAVENOUS | Status: AC | PRN
  Administered 2015-08-09: 125 mL via INTRAVENOUS

## 2015-08-09 NOTE — ED Notes (Signed)
Pt complains of right lower abdominal pain with nausea and vomiting for 1 week, pt reports having blood tinged vomit, pt is in no distress  During triage

## 2015-08-09 NOTE — ED Provider Notes (Signed)
[facility] Emergency Department Provider Note        Time seen: ----------------------------------------- 11:07 AM on 08/09/2015 -----------------------------------------    I have reviewed the triage vital signs and the nursing notes.   HISTORY  Chief Complaint Abdominal Pain    HPI Jennifer Jackson is a 30 y.o. female who presents ER with lower abdominal pain associated with nausea vomiting for the last week. Patient reports having some blood tinged vomit. Patient denies a history of this before, denies any vaginal bleeding or discharge.   Past Medical History  Diagnosis Date  . Asthma   . PTSD (post-traumatic stress disorder)   . Bipolar 1 disorder (HCC)     There are no active problems to display for this patient.   No past surgical history on file.  Allergies Sulfa antibiotics  Social History Social History  Substance Use Topics  . Smoking status: Current Some Day Smoker -- 0.50 packs/day    Types: Cigarettes  . Smokeless tobacco: None  . Alcohol Use: No    Review of Systems Constitutional: Negative for fever. Eyes: Negative for visual changes. ENT: Negative for sore throat. Cardiovascular: Negative for chest pain. Respiratory: Negative for shortness of breath. Gastrointestinal: Positive for abdominal pain and vomiting Genitourinary: Negative for dysuria. Musculoskeletal: Negative for back pain. Skin: Negative for rash. Neurological: Negative for headaches, focal weakness or numbness.  10-point ROS otherwise negative.  ____________________________________________   PHYSICAL EXAM:  VITAL SIGNS: ED Triage Vitals  Enc Vitals Group     BP 08/09/15 0856 116/71 mmHg     Pulse Rate 08/09/15 0856 80     Resp 08/09/15 0856 20     Temp 08/09/15 0856 98 F (36.7 C)     Temp Source 08/09/15 0856 Oral     SpO2 08/09/15 0856 100 %     Weight 08/09/15 0856 310 lb (140.615 kg)     Height 08/09/15 0856  (1.803 m)   Head Cir --      Peak Flow --      Pain Score 08/09/15 0857 7     Pain Loc --      Pain Edu? --      Excl. in GC? --     Constitutional: Alert and oriented. Well appearing and in no distress.Obese Eyes: Conjunctivae are normal. PERRL. Normal extraocular movements. ENT   Head: Normocephalic and atraumatic.   Nose: No congestion/rhinnorhea.   Mouth/Throat: Mucous membranes are moist.   Neck: No stridor. Cardiovascular: Normal rate, regular rhythm. No murmurs, rubs, or gallops. Respiratory: Normal respiratory effort without tachypnea nor retractions. Breath sounds are clear and equal bilaterally. No wheezes/rales/rhonchi. Gastrointestinal: Soft and nontender. Normal bowel sounds Genitourinary: Light vaginal discharge, mild tenderness Musculoskeletal: Nontender with normal range of motion in all extremities. No lower extremity tenderness nor edema. Neurologic:  Normal speech and language. No gross focal neurologic deficits are appreciated.  Skin:  Skin is warm, dry and intact. No rash noted. Psychiatric: Mood and affect are normal. Speech and behavior are normal.  ____________________________________________  ED COURSE:  Pertinent labs & imaging results that were available during my care of the patient were reviewed by me and considered in my medical decision making (see chart for details). Patient is in no acute distress, will need pelvic exam and basic labs. ____________________________________________    LABS (pertinent positives/negatives)  Labs Reviewed  WET PREP, GENITAL - Abnormal; Notable for the following:    WBC, Wet Prep HPF POC FEW (*)  All other components within normal limits  URINALYSIS COMPLETEWITH MICROSCOPIC ([facility] ONLY) - Abnormal; Notable for the following:    Color, Urine YELLOW (*)    APPearance CLOUDY (*)    Hgb urine dipstick 1+ (*)    Bacteria, UA RARE (*)    Squamous Epithelial / LPF TOO NUMEROUS TO COUNT (*)    All other components  within normal limits  COMPREHENSIVE METABOLIC PANEL - Abnormal; Notable for the following:    CO2 21 (*)    Calcium 8.8 (*)    Total Protein 6.4 (*)    ALT 13 (*)    Total Bilirubin 0.2 (*)    All other components within normal limits  CHLAMYDIA/NGC RT PCR ([facility] ONLY)  PREGNANCY, URINE  LIPASE, BLOOD  CBC WITH DIFFERENTIAL/PLATELET  LIPASE, BLOOD  COMPREHENSIVE METABOLIC PANEL  CBC WITH DIFFERENTIAL/PLATELET  POC URINE PREG, ED  POCT PREGNANCY, URINE    RADIOLOGY Images were viewed by me  CT of the abdomen and pelvis with contrast  IMPRESSION: Findings most consistent with a benign left ovarian cyst. This does not require imaging follow up based on appearance alone unless clinical scenario warrants.  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 The lesion is almost certainly benign and incidental, but a follow up CT in 6 months could be performed to ensure stability. ____________________________________________  FINAL ASSESSMENT AND PLAN  Abdominal pain, Ovarian cyst, cystitis  Plan: Patient with labs and imaging as dictated above. Patient clinically with mild tenderness on examination. Clinical suspicion for ovarian torsion will be very low. I will advise GYN follow-up, she will be discharged with pain medicine and a short course of antibiotics for UTI.  Emily FilbertWilliams, Amunique Neyra E, MD   Note: This dictation was prepared with Dragon dictation. Any transcriptional errors that result from this process are unintentional   Emily FilbertJonathan E Akili Cuda, MD 08/09/15 1345

## 2015-08-09 NOTE — Discharge Instructions (Signed)
Urinary Tract Infection Urinary tract infections (UTIs) can develop anywhere along your urinary tract. Your urinary tract is your body's drainage system for removing wastes and extra water. Your urinary tract includes two kidneys, two ureters, a bladder, and a urethra. Your kidneys are a pair of bean-shaped organs. Each kidney is about the size of your fist. They are located below your ribs, one on each side of your spine. CAUSES Infections are caused by microbes, which are microscopic organisms, including fungi, viruses, and bacteria. These organisms are so small that they can only be seen through a microscope. Bacteria are the microbes that most commonly cause UTIs. SYMPTOMS  Symptoms of UTIs may vary by age and gender of the patient and by the location of the infection. Symptoms in young women typically include a frequent and intense urge to urinate and a painful, burning feeling in the bladder or urethra during urination. Older women and men are more likely to be tired, shaky, and weak and have muscle aches and abdominal pain. A fever may mean the infection is in your kidneys. Other symptoms of a kidney infection include pain in your back or sides below the ribs, nausea, and vomiting. DIAGNOSIS To diagnose a UTI, your caregiver will ask you about your symptoms. Your caregiver will also ask you to provide a urine sample. The urine sample will be tested for bacteria and white blood cells. White blood cells are made by your body to help fight infection. TREATMENT  Typically, UTIs can be treated with medication. Because most UTIs are caused by a bacterial infection, they usually can be treated with the use of antibiotics. The choice of antibiotic and length of treatment depend on your symptoms and the type of bacteria causing your infection. HOME CARE INSTRUCTIONS  If you were prescribed antibiotics, take them exactly as your caregiver instructs you. Finish the medication even if you feel better after  you have only taken some of the medication.  Drink enough water and fluids to keep your urine clear or pale yellow.  Avoid caffeine, tea, and carbonated beverages. They tend to irritate your bladder.  Empty your bladder often. Avoid holding urine for long periods of time.  Empty your bladder before and after sexual intercourse.  After a bowel movement, women should cleanse from front to back. Use each tissue only once. SEEK MEDICAL CARE IF:   You have back pain.  You develop a fever.  Your symptoms do not begin to resolve within 3 days. SEEK IMMEDIATE MEDICAL CARE IF:   You have severe back pain or lower abdominal pain.  You develop chills.  You have nausea or vomiting.  You have continued burning or discomfort with urination. MAKE SURE YOU:   Understand these instructions.  Will watch your condition.  Will get help right away if you are not doing well or get worse.   This information is not intended to replace advice given to you by your health care provider. Make sure you discuss any questions you have with your health care provider.   Document Released: 11/27/2004 Document Revised: 11/08/2014 Document Reviewed: 03/28/2011 Elsevier Interactive Patient Education 2016 Elsevier Inc[employer] Ovarian Cyst An ovarian cyst is a fluid-filled sac that forms on an ovary. The ovaries are small organs that produce eggs in women. Various types of cysts can form on the ovaries. Most are not cancerous. Many do not cause problems, and they often go away on their own. Some may cause symptoms and require treatment. Common types of ovarian  cysts include:  Functional cysts--These cysts may occur every month during the menstrual cycle. This is normal. The cysts usually go away with the next menstrual cycle if the woman does not get pregnant. Usually, there are no symptoms with a functional cyst.  Endometrioma cysts--These cysts form from the tissue that lines the uterus. They are also called  "chocolate cysts" because they become filled with blood that turns brown. This type of cyst can cause pain in the lower abdomen during intercourse and with your menstrual period.  Cystadenoma cysts--This type develops from the cells on the outside of the ovary. These cysts can get very big and cause lower abdomen pain and pain with intercourse. This type of cyst can twist on itself, cut off its blood supply, and cause severe pain. It can also easily rupture and cause a lot of pain.  Dermoid cysts--This type of cyst is sometimes found in both ovaries. These cysts may contain different kinds of body tissue, such as skin, teeth, hair, or cartilage. They usually do not cause symptoms unless they get very big.  Theca lutein cysts--These cysts occur when too much of a certain hormone (human chorionic gonadotropin) is produced and overstimulates the ovaries to produce an egg. This is most common after procedures used to assist with the conception of a baby (in vitro fertilization). CAUSES   Fertility drugs can cause a condition in which multiple large cysts are formed on the ovaries. This is called ovarian hyperstimulation syndrome.  A condition called polycystic ovary syndrome can cause hormonal imbalances that can lead to nonfunctional ovarian cysts. SIGNS AND SYMPTOMS  Many ovarian cysts do not cause symptoms. If symptoms are present, they may include:  Pelvic pain or pressure.  Pain in the lower abdomen.  Pain during sexual intercourse.  Increasing girth (swelling) of the abdomen.  Abnormal menstrual periods.  Increasing pain with menstrual periods.  Stopping having menstrual periods without being pregnant. DIAGNOSIS  These cysts are commonly found during a routine or annual pelvic exam. Tests may be ordered to find out more about the cyst. These tests may include:  Ultrasound.  X-ray of the pelvis.  CT scan.  MRI.  Blood tests. TREATMENT  Many ovarian cysts go away on their own  without treatment. Your health care provider may want to check your cyst regularly for 2-3 months to see if it changes. For women in menopause, it is particularly important to monitor a cyst closely because of the higher rate of ovarian cancer in menopausal women. When treatment is needed, it may include any of the following:  A procedure to drain the cyst (aspiration). This may be done using a long needle and ultrasound. It can also be done through a laparoscopic procedure. This involves using a thin, lighted tube with a tiny camera on the end (laparoscope) inserted through a small incision.  Surgery to remove the whole cyst. This may be done using laparoscopic surgery or an open surgery involving a larger incision in the lower abdomen.  Hormone treatment or birth control pills. These methods are sometimes used to help dissolve a cyst. HOME CARE INSTRUCTIONS   Only take over-the-counter or prescription medicines as directed by your health care provider.  Follow up with your health care provider as directed.  Get regular pelvic exams and Pap tests. SEEK MEDICAL CARE IF:   Your periods are late, irregular, or painful, or they stop.  Your pelvic pain or abdominal pain does not go away.  Your abdomen becomes larger  or swollen.  You have pressure on your bladder or trouble emptying your bladder completely.  You have pain during sexual intercourse.  You have feelings of fullness, pressure, or discomfort in your stomach.  You lose weight for no apparent reason.  You feel generally ill.  You become constipated.  You lose your appetite.  You develop acne.  You have an increase in body and facial hair.  You are gaining weight, without changing your exercise and eating habits.  You think you are pregnant. SEEK IMMEDIATE MEDICAL CARE IF:   You have increasing abdominal pain.  You feel sick to your stomach (nauseous), and you throw up (vomit).  You develop a fever that comes on  suddenly.  You have abdominal pain during a bowel movement.  Your menstrual periods become heavier than usual. MAKE SURE YOU:  Understand these instructions.  Will watch your condition.  Will get help right away if you are not doing well or get worse.   This information is not intended to replace advice given to you by your health care provider. Make sure you discuss any questions you have with your health care provider.   Document Released: 02/17/2005 Document Revised: 02/22/2013 Document Reviewed: 10/25/2012 Elsevier Interactive Patient Education [employer]2016 Elsevier Inc.

## 2015-10-25 ENCOUNTER — Encounter: Payer: Self-pay | Admitting: Emergency Medicine

## 2015-10-25 ENCOUNTER — Emergency Department
Admission: EM | Admit: 2015-10-25 | Discharge: 2015-10-25 | Disposition: A | Discharge status: Home / Self Care | Payer: Self-pay | Attending: Emergency Medicine | Admitting: Emergency Medicine

## 2015-10-25 DIAGNOSIS — B353 Tinea pedis: Secondary | ICD-10-CM | POA: Insufficient documentation

## 2015-10-25 DIAGNOSIS — F17210 Nicotine dependence, cigarettes, uncomplicated: Secondary | ICD-10-CM | POA: Insufficient documentation

## 2015-10-25 DIAGNOSIS — J45909 Unspecified asthma, uncomplicated: Secondary | ICD-10-CM | POA: Insufficient documentation

## 2015-10-25 MED ORDER — KETOCONAZOLE 2 % EX CREA: 1.0000 "application " | TOPICAL_CREAM | Freq: Two times a day (BID) | CUTANEOUS | 1 refills | Status: DC

## 2015-10-25 NOTE — ED Triage Notes (Signed)
Patient presents to the ED with rash and pain to left foot x 1.5 weeks.  Patient reports small painful pustules to left foot.  Patient states, "I think I have a really bad case of athletes foot."  Patient ambulatory to triage without obvious difficulty.  Patient denies using any medications or creams/powders to foot.

## 2015-10-25 NOTE — ED Notes (Signed)
See triage note developed a rash and pain to left foot about 1-2 weeks ago

## 2015-10-25 NOTE — ED Provider Notes (Signed)
[facility] Emergency Department Provider Note ____________________________________________  Time seen: Approximately 11:00 AM  I have reviewed the triage vital signs and the nursing notes.   HISTORY  Chief Complaint Foot Pain and Rash    HPI Jennifer Jackson is a 30 y.o. female presents to the emergency department for evaluation ofleft foot pain and rash that started approximately a week and half ago. She states that she lives in the shelter and showers there, but does not wear shower shoes and feels that she has gotten athlete's foot. She states that the area has been burning and itching much worse over the past couple of days. She has not applied any creams, powders, or ointments to the area. She denies history of the same symptoms.  Past Medical History:  Diagnosis Date  . Asthma   . Bipolar 1 disorder (HCC)   . PTSD (post-traumatic stress disorder)     There are no active problems to display for this patient.   History reviewed. No pertinent surgical history.  Prior to Admission medications   Medication Sig Start Date End Date Taking? Authorizing Provider  ciprofloxacin (CIPRO) 250 MG tablet Take 1 tablet (250 mg total) by mouth 2 (two) times daily. 08/09/15   Emily FilbertJonathan E Williams, MD  escitalopram (LEXAPRO) 10 MG tablet Take 10 mg by mouth daily.    Historical Provider, MD  ibuprofen (ADVIL,MOTRIN) 200 MG tablet Take 200 mg by mouth every 6 (six) hours as needed.    Historical Provider, MD  ibuprofen (ADVIL,MOTRIN) 800 MG tablet Take 1 tablet (800 mg total) by mouth every 8 (eight) hours as needed. 08/09/15   Emily FilbertJonathan E Williams, MD  ketoconazole (NIZORAL) 2 % cream Apply 1 application topically 2 (two) times daily. 10/25/15   Chinita Pesterari B Yasir Kitner, FNP  lurasidone (LATUDA) 40 MG TABS tablet Take 40 mg by mouth daily with breakfast.    Historical Provider, MD    Allergies Sulfa antibiotics  No family history on file.  Social History Social History   Substance Use Topics  . Smoking status: Current Some Day Smoker    Packs/day: 0.50    Types: Cigarettes  . Smokeless tobacco: Never Used  . Alcohol use No    Review of Systems Constitutional: No recent illness. Cardiovascular: Denies chest pain or palpitations. Respiratory: Denies shortness of breath. Musculoskeletal: No pain Skin: Positive for rash and left foot. Neurological: Negative for focal weakness or numbness.  ____________________________________________   PHYSICAL EXAM:  VITAL SIGNS: ED Triage Vitals  Enc Vitals Group     BP 10/25/15 1027 113/71     Pulse Rate 10/25/15 1027 83     Resp 10/25/15 1027 18     Temp 10/25/15 1027 98.3 F (36.8 C)     Temp Source 10/25/15 1027 Oral     SpO2 10/25/15 1027 98 %     Weight 10/25/15 1028 (!) 310 lb (140.6 kg)     Height 10/25/15 1028 5\' 11"  (1.803 m)     Head Circumference --      Peak Flow --      Pain Score 10/25/15 1028 7     Pain Loc --      Pain Edu? --      Excl. in GC? --     Constitutional: Alert and oriented. Well appearing and in no acute distress. Eyes: Conjunctivae are normal. EOMI. Head: Atraumatic. Neck: No stridor.  Respiratory: Normal respiratory effort.   Musculoskeletal: Full range of motion throughout Neurologic:  Normal  speech and language. No gross focal neurologic deficits are appreciated. Speech is normal. No gait instability. Skin:  Maculopapular rash with dark base and interdigital, pustular lesions and fissure of the second and third toes on the left foot. Psychiatric: Mood and affect are normal. Speech and behavior are normal.  ____________________________________________   LABS (all labs ordered are listed, but only abnormal results are displayed)  Labs Reviewed - No data to display ____________________________________________  RADIOLOGY   ____________________________________________   PROCEDURES  Procedure(s) performed:  None   ____________________________________________   INITIAL IMPRESSION / ASSESSMENT AND PLAN / ED COURSE  Pertinent labs & imaging results that were available during my care of the patient were reviewed by me and considered in my medical decision making (see chart for details).  Patient was given a prescription for ketoconazole and advised to keep her foot as dry as possible. She was advised to wear shower shoes while at the shelter. She was advised to follow-up with the primary care provider for choice if symptoms are not improving over the next couple of weeks. She was advised to return to the emergency department for symptoms that change or worsen if some unable schedule an appointment. ____________________________________________   FINAL CLINICAL IMPRESSION(S) / ED DIAGNOSES  Final diagnoses:  Tinea pedis of right foot       Chinita PesterCari B Karter Hellmer, FNP 10/25/15 1242    Minna AntisKevin Paduchowski, MD 11/10/15 2255

## 2016-01-09 ENCOUNTER — Encounter: Payer: Self-pay | Admitting: Emergency Medicine

## 2016-01-09 ENCOUNTER — Emergency Department
Admission: EM | Admit: 2016-01-09 | Discharge: 2016-01-09 | Disposition: A | Discharge status: Home / Self Care | Payer: Self-pay | Attending: Student in an Organized Health Care Education/Training Program | Admitting: Student in an Organized Health Care Education/Training Program

## 2016-01-09 DIAGNOSIS — Y939 Activity, unspecified: Secondary | ICD-10-CM | POA: Insufficient documentation

## 2016-01-09 DIAGNOSIS — Z79899 Other long term (current) drug therapy: Secondary | ICD-10-CM | POA: Insufficient documentation

## 2016-01-09 DIAGNOSIS — F17210 Nicotine dependence, cigarettes, uncomplicated: Secondary | ICD-10-CM | POA: Insufficient documentation

## 2016-01-09 DIAGNOSIS — Y929 Unspecified place or not applicable: Secondary | ICD-10-CM | POA: Insufficient documentation

## 2016-01-09 DIAGNOSIS — Y999 Unspecified external cause status: Secondary | ICD-10-CM | POA: Insufficient documentation

## 2016-01-09 DIAGNOSIS — K047 Periapical abscess without sinus: Secondary | ICD-10-CM | POA: Insufficient documentation

## 2016-01-09 DIAGNOSIS — X58XXXA Exposure to other specified factors, initial encounter: Secondary | ICD-10-CM | POA: Insufficient documentation

## 2016-01-09 DIAGNOSIS — J45909 Unspecified asthma, uncomplicated: Secondary | ICD-10-CM | POA: Insufficient documentation

## 2016-01-09 DIAGNOSIS — Z791 Long term (current) use of non-steroidal anti-inflammatories (NSAID): Secondary | ICD-10-CM | POA: Insufficient documentation

## 2016-01-09 DIAGNOSIS — S025XXA Fracture of tooth (traumatic), initial encounter for closed fracture: Secondary | ICD-10-CM | POA: Insufficient documentation

## 2016-01-09 DIAGNOSIS — Z792 Long term (current) use of antibiotics: Secondary | ICD-10-CM | POA: Insufficient documentation

## 2016-01-09 MED ORDER — MAGIC MOUTHWASH W/LIDOCAINE: 5.0000 mL | Freq: Four times a day (QID) | ORAL | 0 refills | Status: DC

## 2016-01-09 MED ORDER — LIDOCAINE-EPINEPHRINE 2 %-1:100000 IJ SOLN
1.7000 mL | Freq: Once | INTRAMUSCULAR | Status: AC
  Administered 2016-01-09: 1.7 mL
  Filled 2016-01-09: qty 1.7

## 2016-01-09 MED ORDER — AMOXICILLIN 875 MG PO TABS: 875.0000 mg | ORAL_TABLET | Freq: Two times a day (BID) | ORAL | 0 refills | Status: DC

## 2016-01-09 NOTE — ED Provider Notes (Signed)
[facility] Emergency Department Provider Note  ____________________________________________  Time seen: Approximately 5:24 PM  I have reviewed the triage vital signs and the nursing notes.   HISTORY  Chief Complaint Dental Pain    HPI Jennifer Jackson is a 30 y.o. female who presents emergency department complaining of left lower dental pain. Patient states that she has caries extending into the pulp as well as a fractured tooth to that side. She's been trying to control symptoms with or gallop states that pain has increased over the past several days. Patient denies any fevers or chills, difficulty breathing or swallowing, chest pain, shortness of breath, abdominal pain, nausea or vomiting. Patient does not have a dentist.   Past Medical History:  Diagnosis Date  . Asthma   . Bipolar 1 disorder (HCC)   . PTSD (post-traumatic stress disorder)     There are no active problems to display for this patient.   Past Surgical History:  Procedure Laterality Date  . WISDOM TOOTH EXTRACTION      Prior to Admission medications   Medication Sig Start Date End Date Taking? Authorizing Provider  amoxicillin (AMOXIL) 875 MG tablet Take 1 tablet (875 mg total) by mouth 2 (two) times daily. 01/09/16   Delorise Royals Thana Ramp, PA-C  ciprofloxacin (CIPRO) 250 MG tablet Take 1 tablet (250 mg total) by mouth 2 (two) times daily. 08/09/15   Emily Filbert, MD  escitalopram (LEXAPRO) 10 MG tablet Take 10 mg by mouth daily.    Historical Provider, MD  ibuprofen (ADVIL,MOTRIN) 200 MG tablet Take 200 mg by mouth every 6 (six) hours as needed.    Historical Provider, MD  ibuprofen (ADVIL,MOTRIN) 800 MG tablet Take 1 tablet (800 mg total) by mouth every 8 (eight) hours as needed. 08/09/15   Emily Filbert, MD  ketoconazole (NIZORAL) 2 % cream Apply 1 application topically 2 (two) times daily. 10/25/15   Chinita Pester, FNP  lurasidone (LATUDA) 40 MG TABS tablet Take 40 mg by  mouth daily with breakfast.    Historical Provider, MD  magic mouthwash w/lidocaine SOLN Take 5 mLs by mouth 4 (four) times daily. 01/09/16   Delorise Royals Marquavion Venhuizen, PA-C    Allergies Sulfa antibiotics  No family history on file.  Social History Social History  Substance Use Topics  . Smoking status: Current Some Day Smoker    Packs/day: 0.50    Types: Cigarettes  . Smokeless tobacco: Never Used  . Alcohol use No     Review of Systems  Constitutional: No fever/chills ENT: Positive for left lower dental pain Cardiovascular: no chest pain. Respiratory: no cough. No SOB. Musculoskeletal: Negative for musculoskeletal pain. Skin: Negative for rash, abrasions, lacerations, ecchymosis. Neurological: Negative for headaches, focal weakness or numbness. 10-point ROS otherwise negative.  ____________________________________________   PHYSICAL EXAM:  VITAL SIGNS: ED Triage Vitals  Enc Vitals Group     BP 01/09/16 1650 130/81     Pulse Rate 01/09/16 1650 75     Resp 01/09/16 1650 18     Temp 01/09/16 1650 98.4 F (36.9 C)     Temp Source 01/09/16 1650 Oral     SpO2 01/09/16 1650 100 %     Weight 01/09/16 1651 (!) 306 lb (138.8 kg)     Height 01/09/16 1651 5' 10.5" (1.791 m)     Head Circumference --      Peak Flow --      Pain Score 01/09/16 1651 10  Pain Loc --      Pain Edu? --      Excl. in GC? --      Constitutional: Alert and oriented. Well appearing and in no acute distress. Eyes: Conjunctivae are normal. PERRL. EOMI. Head: Atraumatic. ENT:      Ears:       Nose: No congestion/rhinnorhea.      Mouth/Throat: Mucous membranes are moist. Fractured dentition noted to tooth #19. Tooth #18 has significant caries. Both teeth have surrounding erythema and edema. Areas are not fluctuant with palpation or tongue depressor. No drainage noted. Patient does have multiple other caries throughout dentition but no other significant findings. Oropharynx is not erythematous and  not edematous. Uvula is midline. Neck: No stridor.   Hematological/Lymphatic/Immunilogical: No cervical lymphadenopathy. Cardiovascular: Normal rate, regular rhythm. Normal S1 and S2.  Good peripheral circulation. Respiratory: Normal respiratory effort without tachypnea or retractions. Lungs CTAB. Good air entry to the bases with no decreased or absent breath sounds. Musculoskeletal: Full range of motion to all extremities. No gross deformities appreciated. Neurologic:  Normal speech and language. No gross focal neurologic deficits are appreciated.  Skin:  Skin is warm, dry and intact. No rash noted. Psychiatric: Mood and affect are normal. Speech and behavior are normal. Patient exhibits appropriate insight and judgement.   ____________________________________________   LABS (all labs ordered are listed, but only abnormal results are displayed)  Labs Reviewed - No data to display ____________________________________________  EKG   ____________________________________________  RADIOLOGY   No results found.  ____________________________________________    PROCEDURES  Procedure(s) performed:    Procedures  Dental block is performed using 1.7 mL of lidocaine with epinephrine. This is injected using the dental carpi jet into the inferior alveolar branch of the trigeminal nerve. Patient tolerated well with no complications.  Medications  lidocaine-EPINEPHrine (XYLOCAINE W/EPI) 2 %-1:100000 (with pres) injection 1.7 mL (1.7 mLs Infiltration Given 01/09/16 1804)     ____________________________________________   INITIAL IMPRESSION / ASSESSMENT AND PLAN / ED COURSE  Pertinent labs & imaging results that were available during my care of the patient were reviewed by me and considered in my medical decision making (see chart for details).  Review of [place] CSRS was performed in accordance of the [facility] prior to dispensing any controlled drugs.  Clinical Course      Patient's diagnosis is consistent with Dental infection and dental fracture. Patient is given a dental block emergency department with good symptom control.. Patient will be discharged home with prescriptions for antibiotics and Magic mouthwash. Patient is to follow up with dentist as needed or otherwise directed. Patient is given ED precautions to return to the ED for any worsening or new symptoms.     ____________________________________________  FINAL CLINICAL IMPRESSION(S) / ED DIAGNOSES  Final diagnoses:  Dental abscess  Closed fracture of tooth, initial encounter      NEW MEDICATIONS STARTED DURING THIS VISIT:  Discharge Medication List as of 01/09/2016  6:03 PM    START taking these medications   Details  amoxicillin (AMOXIL) 875 MG tablet Take 1 tablet (875 mg total) by mouth 2 (two) times daily., Starting Wed 01/09/2016, Print    magic mouthwash w/lidocaine SOLN Take 5 mLs by mouth 4 (four) times daily., Starting Wed 01/09/2016, Print            This chart was dictated using voice recognition software/Dragon. Despite best efforts to proofread, errors can occur which can change the meaning. Any change was purely unintentional.  Delorise RoyalsJonathan D Brenae Lasecki, PA-C 01/09/16 1837    Willy EddyPatrick Robinson, MD 01/09/16 1907

## 2016-01-09 NOTE — ED Triage Notes (Signed)
Pt comes into the ED via POV c/o dental pain on the left lower side.  Patient in NAD at this time with even and unlabored respirations.

## 2016-01-09 NOTE — Discharge Instructions (Signed)
OPTIONS FOR DENTAL FOLLOW UP CARE ° °[place] Department of Health and Human Services - Local Safety Net Dental Clinics °http://www.ncdhhs.gov/dph/oralhealth/services/safetynetclinics.htm °  °[facility] (336-562-3123) ° °[facility] [place] (919-933-9087) ° °[facility] ([phone] ext [phone]) ° °[facility] ([phone]) ° °[facility] ([phone]) °This clinic caters to the indigent population and is on a lottery system. °Location: °[facility], [address], [place] °Clinic Hours: °Wednesdays from 6pm - 9pm, patients seen by a lottery system. °For dates, call or go to [email or web address] °Services: °Cleanings, fillings and simple extractions. °Payment Options: °DENTAL WORK IS FREE OF CHARGE. Bring proof of income or support. °Best way to get seen: °Arrive at 5:15 pm - this is a lottery, NOT first come/first serve, so arriving earlier will not increase your chances of being seen. °  °  °[facility] °[phone] °Select option 1 for emergencies °  °Location: °[facility], [address], [place] °Clinic Hours: °No walk-ins accepted - call the day before to schedule an appointment. °Check in times are 9:30 am and 1:30 pm. °Services: °Simple extractions, temporary fillings, pulpectomy/pulp debridement, uncomplicated abscess drainage. °Payment Options: °PAYMENT IS DUE AT THE TIME OF SERVICE.  Fee is usually $100-200, additional surgical procedures (e.g. abscess drainage) may be extra. °Cash, checks, Visa/MasterCard accepted.  Can file Medicaid if patient is covered for dental - patient should call case worker to check. °No discount for [facility] patients. °Best way to get seen: °MUST call the day before and get onto the schedule. Can usually be seen the next 1-2 days. No walk-ins accepted. °  °  °[facility] °[phone] °   °Location: °[facility], [address], [place] °Clinic Hours: °M, W, Th, F 8am or 1:30pm, Tues 9a or 1:30 - first come/first served. °Services: °Simple extractions, temporary fillings, uncomplicated abscess drainage.  You do not need to be an [place] resident. °Payment Options: °PAYMENT IS DUE AT THE TIME OF SERVICE. °Dental insurance, otherwise sliding scale - bring proof of income or support. °Depending on income and treatment needed, cost is usually $50-200. °Best way to get seen: °Arrive early as it is first come/first served. °  °  °[facility] °[phone] °  °Location: °[address] °Clinic Hours: °Mon-Thu 8a-5p °Services: °Most basic dental services including extractions and fillings. °Payment Options: °PAYMENT IS DUE AT THE TIME OF SERVICE. °Sliding scale, up to 50% off - bring proof if income or support. °Medicaid with dental option accepted. °Best way to get seen: °Call to schedule an appointment, can usually be seen within 2 weeks OR they will try to see walk-ins - show up at 8a or 2p (you may have to wait). °  °  °[facility] °919-245-2435 °[place] RESIDENTS ONLY °  °Location: °[facility], [address], [place], [place] [address] °Clinic Hours: By appointment only. °Monday - Thursday 8am-5pm, Friday 8am-12pm °Services: Cleanings, fillings, extractions. °Payment Options: °PAYMENT IS DUE AT THE TIME OF SERVICE. °Cash, Visa or MasterCard. Sliding scale - $30 minimum per service. °Best way to get seen: °Come in to office, complete packet and make an appointment - need proof of income °or support monies for each household member and proof of [place] residence. °Usually takes about a month to get in. °  °  °[facility] °[phone] °  °Location: °[address]   [place] °Clinic Hours: Walk-in Urgent Care Dental Services are offered Monday-Friday  mornings only. °The numbers of emergencies accepted daily is limited to the number of °providers available. °Maximum 15 - Mondays, Wednesdays & Thursdays °Maximum 10 - Tuesdays & Fridays °Services: °You do not need to be a [place] resident to be seen for a dental emergency. °Emergencies are defined as pain, swelling, abnormal bleeding, or dental trauma. Walkins will receive x-rays if needed. °NOTE: Dental cleaning is not an emergency. °Payment Options: °PAYMENT IS DUE AT THE TIME OF SERVICE. °Minimum co-pay is $40.00 for uninsured patients. °Minimum co-pay is $3.00 for Medicaid with dental coverage. °Dental Insurance is accepted and must be presented at time of visit. °Medicare does not cover dental. °Forms of payment: Cash, credit card, checks. °Best way to get seen: °If not previously registered with the clinic, walk-in dental registration begins at 7:15 am and is on a first come/first serve basis. °If previously registered with the clinic, call to make an appointment. °  °  °[facility] °[phone] °[place] RESIDENTS ONLY °  °Location: °[address], [place], [place] °Clinic Hours: °Mon-Thu 10a-2p °Services: Extractions only! °Payment Options: °FREE (donations accepted) - bring proof of income or support °Best way to get seen: °Call and schedule an appointment OR come at 8am on the 1st Monday of every month (except for holidays) when it is first come/first served. °  °  °[facility] °[phone] °  °Location: °[address], [place] °Clinic Hours: °Friday mornings °Services, Payment Options, Best way to get seen: °Call for info °

## 2017-10-12 ENCOUNTER — Encounter: Payer: Self-pay | Admitting: Emergency Medicine

## 2017-10-12 ENCOUNTER — Emergency Department
Admission: EM | Admit: 2017-10-12 | Discharge: 2017-10-12 | Disposition: A | Discharge status: Home / Self Care | Payer: Worker's Compensation | Attending: Student in an Organized Health Care Education/Training Program | Admitting: Student in an Organized Health Care Education/Training Program

## 2017-10-12 ENCOUNTER — Other Ambulatory Visit: Payer: Self-pay

## 2017-10-12 DIAGNOSIS — J45909 Unspecified asthma, uncomplicated: Secondary | ICD-10-CM | POA: Insufficient documentation

## 2017-10-12 DIAGNOSIS — F17210 Nicotine dependence, cigarettes, uncomplicated: Secondary | ICD-10-CM | POA: Insufficient documentation

## 2017-10-12 DIAGNOSIS — T63441A Toxic effect of venom of bees, accidental (unintentional), initial encounter: Secondary | ICD-10-CM | POA: Insufficient documentation

## 2017-10-12 DIAGNOSIS — L03114 Cellulitis of left upper limb: Secondary | ICD-10-CM | POA: Insufficient documentation

## 2017-10-12 DIAGNOSIS — Z79899 Other long term (current) drug therapy: Secondary | ICD-10-CM | POA: Diagnosis not present

## 2017-10-12 MED ORDER — PREDNISONE 10 MG PO TABS: ORAL_TABLET | ORAL | 0 refills | Status: AC

## 2017-10-12 MED ORDER — CEPHALEXIN 500 MG PO CAPS: 500.0000 mg | ORAL_CAPSULE | Freq: Three times a day (TID) | ORAL | 0 refills | Status: DC

## 2017-10-12 NOTE — Discharge Instructions (Signed)
Use warm moist compresses to the area frequently.  Begin taking prednisone 3 tablets once a day for the next 3 days for inflammation and pain which will also reduce some of the swelling.  Also begin taking Keflex 500 mg 3 times daily until finished.  This will help with skin infection.  You will need to follow-up with your companies doctor or [facility] acute care if any continued problems.

## 2017-10-12 NOTE — ED Notes (Signed)
UDS for WC preformed by this tech. Ambulated urine to lab and placed in fridge at 1216.

## 2017-10-12 NOTE — ED Provider Notes (Signed)
[facility] Emergency Department Provider Note  ____________________________________________   First MD Initiated Contact with Patient 10/12/17 1049     (approximate)  I have reviewed the triage vital signs and the nursing notes.   HISTORY  Chief Complaint Insect Bite   HPI Jennifer Jackson is a 32 y.o. female presents to the emergency department with complaint of a wasp staying to her left arm 2 days ago.  Patient is filing Workmen's Comp. as this happened at work.  Patient complains of continued soreness and swelling in the area.   Past Medical History:  Diagnosis Date  . Asthma   . Bipolar 1 disorder (HCC)   . PTSD (post-traumatic stress disorder)     There are no active problems to display for this patient.   Past Surgical History:  Procedure Laterality Date  . WISDOM TOOTH EXTRACTION      Prior to Admission medications   Medication Sig Start Date End Date Taking? Authorizing Provider  cephALEXin (KEFLEX) 500 MG capsule Take 1 capsule (500 mg total) by mouth 3 (three) times daily. 10/12/17   Tommi RumpsSummers, Audray Rumore L, PA-C  lurasidone (LATUDA) 40 MG TABS tablet Take 40 mg by mouth daily with breakfast.    [provider]  predniSONE (DELTASONE) 10 MG tablet Take 3 tablets every day for 3 days 10/12/17   Tommi RumpsSummers, Tykisha Areola L, PA-C    Allergies Sulfa antibiotics  No family history on file.  Social History Social History   Tobacco Use  . Smoking status: Current Some Day Smoker    Packs/day: 0.50    Types: Cigarettes  . Smokeless tobacco: Never Used  Substance Use Topics  . Alcohol use: No  . Drug use: No    Review of Systems Constitutional: No fever/chills Cardiovascular: Denies chest pain. Respiratory: Denies shortness of breath. Musculoskeletal: Left elbow pain. Skin: Positive for left elbow bee sting. Neurological: Negative for headaches, focal weakness or numbness. ____________________________________________   PHYSICAL  EXAM:  VITAL SIGNS: ED Triage Vitals  Enc Vitals Group     BP      Pulse      Resp      Temp      Temp src      SpO2      Weight      Height      Head Circumference      Peak Flow      Pain Score      Pain Loc      Pain Edu?      Excl. in GC?    Constitutional: Alert and oriented. Well appearing and in no acute distress. Eyes: Conjunctivae are normal. Head: Atraumatic. Neck: No stridor.   Cardiovascular: Normal rate, regular rhythm. Grossly normal heart sounds.  Good peripheral circulation. Respiratory: Normal respiratory effort.  No retractions. Lungs CTAB. Musculoskeletal: Able to move left elbow and upper left extremity without any difficulty or restriction. Neurologic:  Normal speech and language. No gross focal neurologic deficits are appreciated. Skin:  Skin is warm, dry and intact.  There is an erythematous area to the posterior aspect of the left elbow where patient states she was stung by wasp.  Area is warm to touch.  No drainage is noted from the area. Psychiatric: Mood and affect are normal. Speech and behavior are normal.  ____________________________________________   LABS (all labs ordered are listed, but only abnormal results are displayed)  Labs Reviewed - No data to display   PROCEDURES  Procedure(s) performed:  None  Procedures  Critical Care performed: No  ____________________________________________   INITIAL IMPRESSION / ASSESSMENT AND PLAN / ED COURSE  As part of my medical decision making, I reviewed the following data within the electronic MEDICAL RECORD NUMBER Notes from prior ED visits and [place] Controlled Substance Database  Patient presents with a Workmen's Comp. injury of a wasp sting to her left elbow 2 days ago.  She complains of redness and swelling.  Area appears to be a cellulitis secondary to her bee sting.  Patient was given prednisone 30 mg daily for the next 3 days and Keflex 500 mg 3 times daily for 7 days.  Patient was given a  return to work note with restrictions. ____________________________________________   FINAL CLINICAL IMPRESSION(S) / ED DIAGNOSES  Final diagnoses:  Bee sting, accidental or unintentional, initial encounter  Cellulitis of left upper extremity     ED Discharge Orders         Ordered    predniSONE (DELTASONE) 10 MG tablet     10/12/17 1157    cephALEXin (KEFLEX) 500 MG capsule  3 times daily     10/12/17 1157           Note:  This document was prepared using Dragon voice recognition software and may include unintentional dictation errors.    Tommi RumpsSummers, Javoris Star L, PA-C 10/12/17 1551    Willy Eddyobinson, Patrick, MD 10/13/17 [phone]

## 2017-10-12 NOTE — ED Notes (Signed)
Called and spoke with Jennifer ProsePJ Jackson [phone] at [employer] on [address] in [place] regarding WC d/t WC profile saying that that particular [employer] is not associated with [employer], so therefore this tech needed to know what they required. He stated that a UDS was needed if pt was claiming WC.

## 2017-10-12 NOTE — ED Triage Notes (Signed)
States she had a wasp sting to left arm on Saturday  Left elbow is sore and cont's to swell

## 2018-02-21 ENCOUNTER — Other Ambulatory Visit: Payer: Self-pay

## 2018-02-21 ENCOUNTER — Emergency Department
Admission: EM | Admit: 2018-02-21 | Discharge: 2018-02-21 | Disposition: A | Discharge status: Home / Self Care | Payer: Self-pay | Attending: Emergency Medicine | Admitting: Emergency Medicine

## 2018-02-21 ENCOUNTER — Emergency Department: Payer: Self-pay

## 2018-02-21 ENCOUNTER — Encounter: Payer: Self-pay | Admitting: Radiology

## 2018-02-21 DIAGNOSIS — R110 Nausea: Secondary | ICD-10-CM | POA: Insufficient documentation

## 2018-02-21 DIAGNOSIS — R102 Pelvic and perineal pain: Secondary | ICD-10-CM | POA: Insufficient documentation

## 2018-02-21 DIAGNOSIS — F17210 Nicotine dependence, cigarettes, uncomplicated: Secondary | ICD-10-CM | POA: Insufficient documentation

## 2018-02-21 DIAGNOSIS — J45909 Unspecified asthma, uncomplicated: Secondary | ICD-10-CM | POA: Insufficient documentation

## 2018-02-21 LAB — WET PREP, GENITAL
Clue Cells Wet Prep HPF POC: NONE SEEN
Sperm: NONE SEEN
TRICH WET PREP: NONE SEEN
WBC, Wet Prep HPF POC: NONE SEEN
Yeast Wet Prep HPF POC: NONE SEEN

## 2018-02-21 LAB — URINALYSIS, COMPLETE (UACMP) WITH MICROSCOPIC
BILIRUBIN URINE: NEGATIVE
Glucose, UA: NEGATIVE mg/dL
Ketones, ur: 5 mg/dL — AB
LEUKOCYTES UA: NEGATIVE
Nitrite: NEGATIVE
PH: 6 (ref 5.0–8.0)
Protein, ur: 30 mg/dL — AB
SPECIFIC GRAVITY, URINE: 1.032 — AB (ref 1.005–1.030)

## 2018-02-21 LAB — CBC
HCT: 39.3 % (ref 36.0–46.0)
Hemoglobin: 12.5 g/dL (ref 12.0–15.0)
MCH: 27.9 pg (ref 26.0–34.0)
MCHC: 31.8 g/dL (ref 30.0–36.0)
MCV: 87.7 fL (ref 80.0–100.0)
NRBC: 0 % (ref 0.0–0.2)
PLATELETS: 210 10*3/uL (ref 150–400)
RBC: 4.48 MIL/uL (ref 3.87–5.11)
RDW: 12.9 % (ref 11.5–15.5)
WBC: 6 10*3/uL (ref 4.0–10.5)

## 2018-02-21 LAB — COMPREHENSIVE METABOLIC PANEL
ALK PHOS: 45 U/L (ref 38–126)
ALT: 15 U/L (ref 0–44)
AST: 18 U/L (ref 15–41)
Albumin: 4.1 g/dL (ref 3.5–5.0)
Anion gap: 6 (ref 5–15)
BILIRUBIN TOTAL: 0.5 mg/dL (ref 0.3–1.2)
BUN: 12 mg/dL (ref 6–20)
CALCIUM: 8.5 mg/dL — AB (ref 8.9–10.3)
CO2: 21 mmol/L — AB (ref 22–32)
Chloride: 109 mmol/L (ref 98–111)
Creatinine, Ser: 0.65 mg/dL (ref 0.44–1.00)
GFR calc non Af Amer: >60 mL/min (ref >60)
Glucose, Bld: 105 mg/dL — ABNORMAL HIGH (ref 70–99)
Potassium: 3.9 mmol/L (ref 3.5–5.1)
SODIUM: 136 mmol/L (ref 135–145)
TOTAL PROTEIN: 7.2 g/dL (ref 6.5–8.1)

## 2018-02-21 LAB — CHLAMYDIA/NGC RT PCR (ARMC ONLY)
Chlamydia Tr: NOT DETECTED
N gonorrhoeae: NOT DETECTED

## 2018-02-21 LAB — LIPASE, BLOOD: Lipase: 35 U/L (ref 11–51)

## 2018-02-21 LAB — PREGNANCY, URINE: Preg Test, Ur: NEGATIVE

## 2018-02-21 MED ORDER — OXYCODONE-ACETAMINOPHEN 5-325 MG PO TABS
1.0000 | ORAL_TABLET | Freq: Once | ORAL | Status: AC
  Administered 2018-02-21: 1 via ORAL
  Filled 2018-02-21: qty 1

## 2018-02-21 MED ORDER — SODIUM CHLORIDE 0.9 % IV BOLUS
1000.0000 mL | Freq: Once | INTRAVENOUS | Status: AC
  Administered 2018-02-21: 1000 mL via INTRAVENOUS

## 2018-02-21 MED ORDER — IOHEXOL 300 MG/ML  SOLN
125.0000 mL | Freq: Once | INTRAMUSCULAR | Status: AC | PRN
  Administered 2018-02-21: 125 mL via INTRAVENOUS

## 2018-02-21 NOTE — ED Notes (Signed)
ED Provider at bedside. 

## 2018-02-21 NOTE — ED Notes (Signed)
Pt back from CT

## 2018-02-21 NOTE — ED Triage Notes (Signed)
"  My ovaries hurt really, really bad."

## 2018-02-21 NOTE — ED Notes (Signed)
Patient transported to [facility] 

## 2018-02-21 NOTE — ED Provider Notes (Signed)
[facility], [facility] Emergency Department Provider Note  ____________________________________________   First MD Initiated Contact with Patient 02/21/18 [phone]     (approximate)  I have reviewed the triage vital signs and the nursing notes.   HISTORY  Chief Complaint Abdominal Pain   HPI Jennifer Jackson is a 32 y.o. female with asthma as well as bipolar disorder and ovarian cysts was presented emergency department sudden onset suprapubic abdominal pain that started about 4 AM.  She says that she has about 2 weeks after her last.  The pain is in the midline and radiates through the back.  Says that its most severe it has been a 10 out of 10.  Denies any vaginal discharge.  Denies any vaginal bleeding.  Says that when the pain elevates becomes very severe that she has nausea but has not vomited.  Is concerned about ovarian cyst.  Says that she has had similar pain in the past related to ovarian cyst.  Says that she does not have sex with men and is not concerned with STDs at this time.   Past Medical History:  Diagnosis Date  . Asthma   . Bipolar 1 disorder (HCC)   . PTSD (post-traumatic stress disorder)     There are no active problems to display for this patient.   Past Surgical History:  Procedure Laterality Date  . WISDOM TOOTH EXTRACTION      Prior to Admission medications   Medication Sig Start Date End Date Taking? Authorizing Provider  albuterol (PROVENTIL HFA;VENTOLIN HFA) 108 (90 Base) MCG/ACT inhaler Inhale 1-2 puffs into the lungs every 6 (six) hours as needed for wheezing or shortness of breath.   Yes [provider]  cephALEXin (KEFLEX) 500 MG capsule Take 1 capsule (500 mg total) by mouth 3 (three) times daily. Patient not taking: Reported on 02/21/2018 10/12/17   Tommi RumpsSummers, Rhonda L, PA-C  predniSONE (DELTASONE) 10 MG tablet Take 3 tablets every day for 3 days Patient not taking: Reported on 02/21/2018 10/12/17   Tommi RumpsSummers, Rhonda L, PA-C     Allergies Sulfa antibiotics  No family history on file.  Social History Social History   Tobacco Use  . Smoking status: Current Some Day Smoker    Packs/day: 0.50    Types: Cigarettes  . Smokeless tobacco: Never Used  Substance Use Topics  . Alcohol use: No  . Drug use: No    Review of Systems  Constitutional: No fever/chills Eyes: No visual changes. ENT: No sore throat. Cardiovascular: Denies chest pain. Respiratory: Denies shortness of breath. Gastrointestinal:   No diarrhea.  No constipation. Genitourinary: Negative for dysuria. Musculoskeletal: As above Skin: Negative for rash. Neurological: Negative for headaches, focal weakness or numbness.   ____________________________________________   PHYSICAL EXAM:  VITAL SIGNS: ED Triage Vitals  Enc Vitals Group     BP 02/21/18 0651 116/72     Pulse Rate 02/21/18 0651 94     Resp 02/21/18 0651 18     Temp 02/21/18 0651 98.1 F (36.7 C)     Temp Source 02/21/18 0651 Oral     SpO2 02/21/18 0651 99 %     Weight 02/21/18 0649 (!) 315 lb (142.9 kg)     Height 02/21/18 0649 5' 11.5" (1.816 m)     Head Circumference --      Peak Flow --      Pain Score 02/21/18 0649 10     Pain Loc --      Pain Edu? --  Excl. in GC? --     Constitutional: Alert and oriented.in no acute distress.   Eyes: Conjunctivae are normal.  Head: Atraumatic. Nose: No congestion/rhinnorhea. Mouth/Throat: Mucous membranes are moist.  Neck: No stridor.   Cardiovascular: Normal rate, regular rhythm. Grossly normal heart sounds.   Respiratory: Normal respiratory effort.  No retractions. Lungs CTAB. Gastrointestinal: Soft with mild suprapubic tenderness without rebound or guarding.  No tenderness to the left or right lower quadrants.  No distention. No CVA tenderness. Genitourinary: Normal external exam without any lesions.  Speculum exam with a very small amount of cervical mucus.  Bimanual exam with minimal CMT.  Mild suprapubic as  well as bilateral adnexal tenderness to palpation without any masses palpated. Musculoskeletal: No lower extremity tenderness nor edema.  No joint effusions. Neurologic:  Normal speech and language. No gross focal neurologic deficits are appreciated. Skin:  Skin is warm, dry and intact. No rash noted. Psychiatric: Mood and affect are normal. Speech and behavior are normal.  ____________________________________________   LABS (all labs ordered are listed, but only abnormal results are displayed)  Labs Reviewed  COMPREHENSIVE METABOLIC PANEL - Abnormal; Notable for the following components:      Result Value   CO2 21 (*)    Glucose, Bld 105 (*)    Calcium 8.5 (*)    All other components within normal limits  URINALYSIS, COMPLETE (UACMP) WITH MICROSCOPIC - Abnormal; Notable for the following components:   Color, Urine YELLOW (*)    APPearance HAZY (*)    Specific Gravity, Urine 1.032 (*)    Hgb urine dipstick SMALL (*)    Ketones, ur 5 (*)    Protein, ur 30 (*)    Bacteria, UA RARE (*)    All other components within normal limits  WET PREP, GENITAL  CHLAMYDIA/NGC RT PCR ([facility] ONLY)  LIPASE, BLOOD  CBC  PREGNANCY, URINE  POC URINE PREG, ED   ____________________________________________  EKG   ____________________________________________  RADIOLOGY  Ultrasound of the pelvis without acute pathology.  CT of the abdomen and pelvis without acute pathology. ____________________________________________   PROCEDURES  Procedure(s) performed:   Procedures  Critical Care performed:   ____________________________________________   INITIAL IMPRESSION / ASSESSMENT AND PLAN / ED COURSE  Pertinent labs & imaging results that were available during my care of the patient were reviewed by me and considered in my medical decision making (see chart for details).  Differential diagnosis includes, but is not limited to, ovarian cyst, ovarian torsion, acute  appendicitis, diverticulitis, urinary tract infection/pyelonephritis, endometriosis, bowel obstruction, colitis, renal colic, gastroenteritis, hernia, fibroids, endometriosis, pregnancy related pain including ectopic pregnancy, etc. As part of my medical decision making, I reviewed the following data within the electronic MEDICAL RECORD NUMBER Notes from prior ED visits   ----------------------------------------- 9:54 AM on 02/21/2018 -----------------------------------------  Patient with persistent lower abdominal pain.  We will CAT scan the patient.  Concern for appendicitis versus kidney stone.  However, reassuring lab results.  ----------------------------------------- 1:04 PM on 02/21/2018 -----------------------------------------  Patient says that she is pain-free at this time.  Very reassuring imaging as well as lab work.  Unclear cause of pain.  Possible musculoskeletal such as groin strain.  Patient aware of imaging results as well as lab results.  Will be discharged at this time. ____________________________________________   FINAL CLINICAL IMPRESSION(S) / ED DIAGNOSES  Final diagnoses:  Pelvic pain  Pelvic pain      NEW MEDICATIONS STARTED DURING THIS VISIT:  New Prescriptions   No medications  on file     Note:  This document was prepared using Dragon voice recognition software and may include unintentional dictation errors.     Myrna Blazer, MD 02/21/18 [phone]

## 2018-02-22 LAB — POCT PREGNANCY, URINE: Preg Test, Ur: NEGATIVE

## 2018-09-29 ENCOUNTER — Emergency Department (HOSPITAL_COMMUNITY)
Admission: EM | Admit: 2018-09-29 | Discharge: 2018-09-29 | Disposition: A | Discharge status: Home / Self Care | Payer: Self-pay | Attending: Emergency Medicine | Admitting: Emergency Medicine

## 2018-09-29 ENCOUNTER — Other Ambulatory Visit: Payer: Self-pay

## 2018-09-29 ENCOUNTER — Encounter (HOSPITAL_COMMUNITY): Payer: Self-pay

## 2018-09-29 DIAGNOSIS — B353 Tinea pedis: Secondary | ICD-10-CM | POA: Insufficient documentation

## 2018-09-29 DIAGNOSIS — Z79899 Other long term (current) drug therapy: Secondary | ICD-10-CM | POA: Insufficient documentation

## 2018-09-29 DIAGNOSIS — J45909 Unspecified asthma, uncomplicated: Secondary | ICD-10-CM | POA: Insufficient documentation

## 2018-09-29 DIAGNOSIS — F17210 Nicotine dependence, cigarettes, uncomplicated: Secondary | ICD-10-CM | POA: Insufficient documentation

## 2018-09-29 MED ORDER — CICLOPIROX OLAMINE 0.77 % EX CREA: TOPICAL_CREAM | Freq: Two times a day (BID) | CUTANEOUS | 0 refills | Status: DC

## 2018-09-29 MED ORDER — NYSTATIN 100000 UNIT/GM EX POWD: CUTANEOUS | 0 refills | Status: DC

## 2018-09-29 NOTE — Discharge Instructions (Addendum)
1. Medications: Ciclopirox 2x per day; nystatin powder for use before putting on shoes and socks for work, usual home medications 2. Treatment: rest, drink plenty of fluids, keep feet clean with warm soap and water, change socks during shift, wear open shoes when not at work 3. Follow Up: Please followup with the [facility] for an ER follow-up; Please return to the ER for signs of infection including redness, pus, pain or fever

## 2018-09-29 NOTE — ED Notes (Signed)
Patient verbalizes understanding of discharge instructions. Opportunity for questioning and answers were provided. Armband removed by staff, pt discharged from ED.  

## 2018-09-29 NOTE — ED Provider Notes (Signed)
[facility] Provider Note   CSN: [id number] Arrival date & time: 09/29/18  2040    History   Chief Complaint Chief Complaint  Patient presents with  . Recurrent Skin Infections    HPI Jennifer Jackson is a 33 y.o. female with a hx of asthma, bipolar disorder, PTSD presents to the Emergency Department complaining of gradual, persistent, progressively worsening rash to the left foot onset approximately 1 year ago.  Patient reports it began along the medial aspect and has spread some along the sole of her foot and now a new area has appeared on the lateral portion of her left foot.  The rash is not present on her right foot.  Patient reports using over-the-counter athlete's foot creams and Neosporin without improvement.  She reports occasional drainage from the site but no purulence, erythema or edema.  She denies any lymphadenopathy or streaking.  She denies pain at the site but does report that it is significantly itchy.  She does not have a primary care physician.  No history of diabetes, immunosuppression, steroid use or HIV.  Patient wears open shoes/sandals while at home but is required to wear socks and closed toe shoes at work.     The history is provided by the patient and medical records. No language interpreter was used.    Past Medical History:  Diagnosis Date  . Asthma   . Bipolar 1 disorder (HCC)   . PTSD (post-traumatic stress disorder)     There are no active problems to display for this patient.   Past Surgical History:  Procedure Laterality Date  . WISDOM TOOTH EXTRACTION       OB History   No obstetric history on file.      Home Medications    Prior to Admission medications   Medication Sig Start Date End Date Taking? Authorizing Provider  albuterol (PROVENTIL HFA;VENTOLIN HFA) 108 (90 Base) MCG/ACT inhaler Inhale 1-2 puffs into the lungs every 6 (six) hours as needed for wheezing or shortness of breath.     [provider]  cephALEXin (KEFLEX) 500 MG capsule Take 1 capsule (500 mg total) by mouth 3 (three) times daily. Patient not taking: Reported on 02/21/2018 10/12/17   Tommi RumpsSummers, Rhonda L, PA-C  ciclopirox (LOPROX) 0.77 % cream Apply topically 2 (two) times daily. 09/29/18   Alfie Rideaux, Dahlia ClientHannah, PA-C  nystatin (MYCOSTATIN/NYSTOP) powder Apply to left foot before work 09/29/18   Denaly Gatling, Dahlia ClientHannah, PA-C  predniSONE (DELTASONE) 10 MG tablet Take 3 tablets every day for 3 days Patient not taking: Reported on 02/21/2018 10/12/17   Tommi RumpsSummers, Rhonda L, PA-C    Family History No family history on file.  Social History Social History   Tobacco Use  . Smoking status: Current Some Day Smoker    Packs/day: 0.50    Types: Cigarettes  . Smokeless tobacco: Never Used  Substance Use Topics  . Alcohol use: No  . Drug use: No     Allergies   Sulfa antibiotics   Review of Systems Review of Systems  Constitutional: Negative for fatigue and fever.  Skin: Positive for rash.  Hematological: Negative for adenopathy.     Physical Exam Updated Vital Signs BP 133/87 (BP Location: Left Wrist)   Pulse 74   Temp 98.7 F (37.1 C) (Oral)   Resp 18   SpO2 97%   Physical Exam Vitals signs and nursing note reviewed.  Constitutional:      General: She is not in acute distress.  Appearance: She is well-developed.  HENT:     Head: Normocephalic.  Eyes:     General: No scleral icterus.    Conjunctiva/sclera: Conjunctivae normal.  Neck:     Musculoskeletal: Normal range of motion.  Cardiovascular:     Rate and Rhythm: Normal rate.     Pulses:          Dorsalis pedis pulses are 2+ on the left side.       Posterior tibial pulses are 2+ on the left side.  Pulmonary:     Effort: Pulmonary effort is normal.  Musculoskeletal: Normal range of motion.  Skin:    General: Skin is warm and dry.     Comments: Rash noted to the medial portion of the left foot extending partially along the  sole with a separate patch along the lateral portion which is dry and scaling with several fissures.  One bulla is noted.  No erythema, induration or increased warmth.  Fourth toe does have some scaling rash on it as well.  Neurological:     Mental Status: She is alert.      ED Treatments / Results   Procedures Procedures (including critical care time)  Medications Ordered in ED Medications - No data to display   Initial Impression / Assessment and Plan / ED Course  I have reviewed the triage vital signs and the nursing notes.  Pertinent labs & imaging results that were available during my care of the patient were reviewed by me and considered in my medical decision making (see chart for details).        Patient presents with tinea pedis.  This appears to be vesiculo-bullous in nature.  No evidence of secondary infection.  No purulent drainage.  Foot is well-perfused.  No history of immunosuppression.  No evidence of bullous impetigo.  discussion about foot care with tinea.  Patient will be given Loprox and Mycostatin powder.  She will need [facility]-up with the [facility] for ongoing management.  Discussed reasons to return to the emergency department including signs and symptoms of infection.  Patient states understanding and is in agreement with the plan.  Final Clinical Impressions(s) / ED Diagnoses   Final diagnoses:  Tinea pedis of left foot    ED Discharge Orders         Ordered    ciclopirox (LOPROX) 0.77 % cream  2 times daily     09/29/18 2246    nystatin (MYCOSTATIN/NYSTOP) powder     09/29/18 2246           Winn Muehl, Jarrett Soho, PA-C 09/29/18 2256    Drenda Freeze, MD 09/30/18 [phone]

## 2018-09-29 NOTE — ED Triage Notes (Signed)
Pt reports recurrent athletes foot/rash to left foot. Pt tried multiple OTC medications and creams without relief.

## 2018-09-30 ENCOUNTER — Telehealth (HOSPITAL_COMMUNITY): Payer: Self-pay | Admitting: Emergency Medicine

## 2018-09-30 MED ORDER — NYSTATIN 100000 UNIT/GM EX POWD: Freq: Four times a day (QID) | CUTANEOUS | 0 refills | Status: AC

## 2018-09-30 MED ORDER — CICLOPIROX OLAMINE 0.77 % EX CREA: TOPICAL_CREAM | Freq: Two times a day (BID) | CUTANEOUS | 0 refills | Status: AC

## 2018-11-22 ENCOUNTER — Other Ambulatory Visit: Payer: Self-pay

## 2018-11-22 ENCOUNTER — Emergency Department (HOSPITAL_COMMUNITY): Payer: Self-pay

## 2018-11-22 ENCOUNTER — Emergency Department (HOSPITAL_COMMUNITY)
Admission: EM | Admit: 2018-11-22 | Discharge: 2018-11-22 | Disposition: A | Discharge status: Home / Self Care | Payer: Self-pay | Attending: Emergency Medicine | Admitting: Emergency Medicine

## 2018-11-22 ENCOUNTER — Encounter (HOSPITAL_COMMUNITY): Payer: Self-pay | Admitting: Emergency Medicine

## 2018-11-22 DIAGNOSIS — F17210 Nicotine dependence, cigarettes, uncomplicated: Secondary | ICD-10-CM | POA: Insufficient documentation

## 2018-11-22 DIAGNOSIS — B353 Tinea pedis: Secondary | ICD-10-CM | POA: Insufficient documentation

## 2018-11-22 DIAGNOSIS — J45909 Unspecified asthma, uncomplicated: Secondary | ICD-10-CM | POA: Insufficient documentation

## 2018-11-22 MED ORDER — DOXYCYCLINE HYCLATE 100 MG PO CAPS: 100.0000 mg | ORAL_CAPSULE | Freq: Two times a day (BID) | ORAL | 0 refills | Status: DC

## 2018-11-22 MED ORDER — CLOTRIMAZOLE 1 % EX CREA: TOPICAL_CREAM | CUTANEOUS | 0 refills | Status: DC

## 2018-11-22 MED ORDER — DOXYCYCLINE HYCLATE 100 MG PO CAPS: 100.0000 mg | ORAL_CAPSULE | Freq: Two times a day (BID) | ORAL | 0 refills | Status: AC

## 2018-11-22 NOTE — Discharge Instructions (Signed)
Please read attached information. If you experience any new or worsening signs or symptoms please return to the emergency room for evaluation. Please follow-up with your primary care provider or specialist as discussed. Please use medication prescribed only as directed and discontinue taking if you have any concerning signs or symptoms.   °

## 2018-11-22 NOTE — ED Triage Notes (Signed)
Pt reports she was seen for a yeast infection to L foot one month ago. Pt reports no improvement, reports smell. Pt reports she was treated with cream and powder with no improvement. Pt has scabbed looking wound to bottom of foot and 4th toe. . She reports using A&D and neosporin to keep foot moist due to splitting and cracking of skin to foot.

## 2018-11-22 NOTE — ED Provider Notes (Signed)
[facility] EMERGENCY DEPARTMENT Provider Note   CSN: [id number] Arrival date & time: 11/22/18  1302     History   Chief Complaint Chief Complaint  Patient presents with  . Foot Pain    HPI Jennifer Jackson is a 33 y.o. female.     HPI   33 year old female presents today with complaints of rash to her left foot.  Patient notes that this started when she was in jail approximately 1 year ago.  She was seen and evaluated here and started on antifungal cream.  She notes this ran out her symptoms did not improve and she has not been using the cream since that time, this visit was at the end of July this year.  She notes symptoms have progressed, most of her symptoms are along the left medial plantar foot but also has a patch on the lateral aspect of the foot.  She notes cracking, no significant surrounding redness.  She notes she is not diabetic.    Past Medical History:  Diagnosis Date  . Asthma   . Bipolar 1 disorder (HCC)   . PTSD (post-traumatic stress disorder)     There are no active problems to display for this patient.   Past Surgical History:  Procedure Laterality Date  . WISDOM TOOTH EXTRACTION       OB History   No obstetric history on file.      Home Medications    Prior to Admission medications   Medication Sig Start Date End Date Taking? Authorizing Provider  albuterol (PROVENTIL HFA;VENTOLIN HFA) 108 (90 Base) MCG/ACT inhaler Inhale 1-2 puffs into the lungs every 6 (six) hours as needed for wheezing or shortness of breath.    [provider]  cephALEXin (KEFLEX) 500 MG capsule Take 1 capsule (500 mg total) by mouth 3 (three) times daily. Patient not taking: Reported on 02/21/2018 10/12/17   Tommi Rumps, PA-C  ciclopirox (LOPROX) 0.77 % cream Apply topically 2 (two) times daily. 09/30/18   Lawyer, Cristal Deer, PA-C  clotrimazole (LOTRIMIN) 1 % cream Apply to affected area 2 times daily 11/22/18   Janayla Marik, Tinnie Gens, PA-C   doxycycline (VIBRAMYCIN) 100 MG capsule Take 1 capsule (100 mg total) by mouth 2 (two) times daily. 11/22/18   Amnah Breuer, Tinnie Gens, PA-C  nystatin (MYCOSTATIN/NYSTOP) powder Apply topically 4 (four) times daily. 09/30/18   Lawyer, Cristal Deer, PA-C  predniSONE (DELTASONE) 10 MG tablet Take 3 tablets every day for 3 days Patient not taking: Reported on 02/21/2018 10/12/17   Tommi Rumps, PA-C    Family History History reviewed. No pertinent family history.  Social History Social History   Tobacco Use  . Smoking status: Current Some Day Smoker    Packs/day: 0.50    Types: Cigarettes  . Smokeless tobacco: Never Used  Substance Use Topics  . Alcohol use: No  . Drug use: No     Allergies   Sulfa antibiotics   Review of Systems Review of Systems  All other systems reviewed and are negative.    Physical Exam Updated Vital Signs BP (!) 118/52 (BP Location: Right Arm)   Pulse 97   Temp 98.4 F (36.9 C) (Oral)   Resp 16   Ht 5\' 11"  (1.803 m)   Wt (!) 140.6 kg   SpO2 100%   BMI 43.24 kg/m   Physical Exam Vitals signs and nursing note reviewed.  Constitutional:      Appearance: She is well-developed.  HENT:     Head:  Normocephalic and atraumatic.  Eyes:     General: No scleral icterus.       Right eye: No discharge.        Left eye: No discharge.     Conjunctiva/sclera: Conjunctivae normal.     Pupils: Pupils are equal, round, and reactive to light.  Neck:     Musculoskeletal: Normal range of motion.     Vascular: No JVD.     Trachea: No tracheal deviation.  Pulmonary:     Effort: Pulmonary effort is normal.     Breath sounds: No stridor.  Skin:    Comments: Dry crusting rash noted to the left plantar medial and lateral foot, small amount of erythema, this does extend in the left fourth toe  Neurological:     Mental Status: She is alert and oriented to person, place, and time.     Coordination: Coordination normal.  Psychiatric:        Behavior: Behavior  normal.        Thought Content: Thought content normal.        Judgment: Judgment normal.      ED Treatments / Results  Labs (all labs ordered are listed, but only abnormal results are displayed) Labs Reviewed - No data to display  EKG None  Radiology Dg Foot Complete Left  Result Date: 11/22/2018 CLINICAL DATA:  Plantar foot wound EXAM: LEFT FOOT - COMPLETE 3+ VIEW COMPARISON:  None. FINDINGS: There is no evidence of fracture or dislocation. No cortical destruction or periostitis. There is no evidence of arthropathy or other focal bone abnormality. Soft tissue swelling most pronounced over the plantar and lateral aspects of the foot. No soft tissue gas. No radiopaque foreign body. IMPRESSION: Soft tissue swelling without acute osseous abnormality. Electronically Signed   By: Duanne Guess M.D.   On: 11/22/2018 14:04    Procedures Procedures (including critical care time)  Medications Ordered in ED Medications - No data to display   Initial Impression / Assessment and Plan / ED Course  I have reviewed the triage vital signs and the nursing notes.  Pertinent labs & imaging results that were available during my care of the patient were reviewed by me and considered in my medical decision making (see chart for details).        32 year old female presents today with severe case of tinea.  This has been progressing over the last year.  She does have some cracking to her foot with what appears to be some minor erythema, she has no signs of rapidly spreading infection or severe infection at this time.  I do find it reasonable to place her on topical therapy along with antibiotics, and close follow-up with podiatry.  Patient is not diabetic, she will return immediately if she develops any new or worsening signs or symptoms.  Verbalized understanding and agreement to today's plan had no further questions or concerns.  Final Clinical Impressions(s) / ED Diagnoses   Final diagnoses:   Tinea pedis of left foot    ED Discharge Orders         Ordered    clotrimazole (LOTRIMIN) 1 % cream     11/22/18 1433    doxycycline (VIBRAMYCIN) 100 MG capsule  2 times daily,   Status:  Discontinued     11/22/18 1433    doxycycline (VIBRAMYCIN) 100 MG capsule  2 times daily     11/22/18 1433           Tran Randle, Tinnie Gens, Cordelia Poche 11/22/18  [place], MD 11/28/18 1651

## 2019-02-02 ENCOUNTER — Other Ambulatory Visit: Payer: Self-pay

## 2019-02-02 ENCOUNTER — Emergency Department (HOSPITAL_COMMUNITY)
Admission: EM | Admit: 2019-02-02 | Discharge: 2019-02-02 | Disposition: A | Discharge status: Home / Self Care | Payer: Self-pay | Attending: Emergency Medicine | Admitting: Emergency Medicine

## 2019-02-02 ENCOUNTER — Encounter (HOSPITAL_COMMUNITY): Payer: Self-pay

## 2019-02-02 DIAGNOSIS — Z79899 Other long term (current) drug therapy: Secondary | ICD-10-CM | POA: Insufficient documentation

## 2019-02-02 DIAGNOSIS — J45909 Unspecified asthma, uncomplicated: Secondary | ICD-10-CM | POA: Insufficient documentation

## 2019-02-02 DIAGNOSIS — L280 Lichen simplex chronicus: Secondary | ICD-10-CM | POA: Insufficient documentation

## 2019-02-02 DIAGNOSIS — B353 Tinea pedis: Secondary | ICD-10-CM | POA: Insufficient documentation

## 2019-02-02 DIAGNOSIS — F17210 Nicotine dependence, cigarettes, uncomplicated: Secondary | ICD-10-CM | POA: Insufficient documentation

## 2019-02-02 MED ORDER — CLOTRIMAZOLE 1 % EX CREA: TOPICAL_CREAM | CUTANEOUS | 0 refills | Status: DC

## 2019-02-02 MED ORDER — CEPHALEXIN 500 MG PO CAPS: 500.0000 mg | ORAL_CAPSULE | Freq: Two times a day (BID) | ORAL | 0 refills | Status: AC

## 2019-02-02 MED ORDER — CEPHALEXIN 500 MG PO CAPS: 500.0000 mg | ORAL_CAPSULE | Freq: Two times a day (BID) | ORAL | 0 refills | Status: DC

## 2019-02-02 MED ORDER — NAPROXEN 500 MG PO TABS: 500.0000 mg | ORAL_TABLET | Freq: Two times a day (BID) | ORAL | 0 refills | Status: DC

## 2019-02-02 MED ORDER — CLOTRIMAZOLE 1 % EX CREA: TOPICAL_CREAM | CUTANEOUS | 0 refills | Status: AC

## 2019-02-02 MED ORDER — BETAMETHASONE DIPROPIONATE 0.05 % EX OINT: TOPICAL_OINTMENT | Freq: Two times a day (BID) | CUTANEOUS | 0 refills | Status: DC

## 2019-02-02 MED ORDER — BETAMETHASONE DIPROPIONATE 0.05 % EX OINT: TOPICAL_OINTMENT | Freq: Two times a day (BID) | CUTANEOUS | 0 refills | Status: AC

## 2019-02-02 MED ORDER — NAPROXEN 500 MG PO TABS: 500.0000 mg | ORAL_TABLET | Freq: Two times a day (BID) | ORAL | 0 refills | Status: AC

## 2019-02-02 NOTE — ED Provider Notes (Signed)
[facility]-EMERGENCY DEPT Provider Note   CSN: [id number] Arrival date & time: 02/02/19  1521     History   Chief Complaint Chief Complaint  Patient presents with  . Left Foot Pain    HPI Jennifer Jackson is a 33 y.o. female.     HPI   33 year old female with a history of asthma, eczema, bipolar disorder, PTSD, who presents the emergency department for evaluation of left foot rash and pain.  States that she was diagnosed with athlete's foot earlier this year and was started on a cream.  She has used the cream but it has not necessarily improved her symptoms.  She now has a rash in patches on her foot that are painful.  The areas used to be itchy but they no longer this way.  The rash has been present for several months.  She has since run out of the medication.  She has had some swelling in the foot which is new and concerning to her so she came to the ED.  She does not have a PCP.  She has had no fevers.  Past Medical History:  Diagnosis Date  . Asthma   . Bipolar 1 disorder (HCC)   . PTSD (post-traumatic stress disorder)     There are no active problems to display for this patient.   Past Surgical History:  Procedure Laterality Date  . WISDOM TOOTH EXTRACTION       OB History   No obstetric history on file.      Home Medications    Prior to Admission medications   Medication Sig Start Date End Date Taking? Authorizing Provider  albuterol (PROVENTIL HFA;VENTOLIN HFA) 108 (90 Base) MCG/ACT inhaler Inhale 1-2 puffs into the lungs every 6 (six) hours as needed for wheezing or shortness of breath.    [provider]  betamethasone dipropionate (DIPROLENE) 0.05 % ointment Apply topically 2 (two) times daily. 02/02/19   Haylynn Pha S, PA-C  cephALEXin (KEFLEX) 500 MG capsule Take 1 capsule (500 mg total) by mouth 2 (two) times daily for 7 days. 02/02/19 02/09/19  Agueda Houpt S, PA-C  ciclopirox (LOPROX) 0.77 % cream Apply topically 2  (two) times daily. 09/30/18   Lawyer, Cristal Deer, PA-C  clotrimazole (LOTRIMIN) 1 % cream Apply to affected area 2 times daily 02/02/19   Kirbie Stodghill S, PA-C  doxycycline (VIBRAMYCIN) 100 MG capsule Take 1 capsule (100 mg total) by mouth 2 (two) times daily. 11/22/18   Hedges, Tinnie Gens, PA-C  naproxen (NAPROSYN) 500 MG tablet Take 1 tablet (500 mg total) by mouth 2 (two) times daily. 02/02/19   Kruz Chiu S, PA-C  nystatin (MYCOSTATIN/NYSTOP) powder Apply topically 4 (four) times daily. 09/30/18   Lawyer, Cristal Deer, PA-C  predniSONE (DELTASONE) 10 MG tablet Take 3 tablets every day for 3 days Patient not taking: Reported on 02/21/2018 10/12/17   Tommi Rumps, PA-C    Family History History reviewed. No pertinent family history.  Social History Social History   Tobacco Use  . Smoking status: Current Some Day Smoker    Packs/day: 0.50    Types: Cigarettes  . Smokeless tobacco: Never Used  Substance Use Topics  . Alcohol use: No  . Drug use: No     Allergies   Sulfa antibiotics   Review of Systems Review of Systems  Constitutional: Negative for fever.  Musculoskeletal:       Left foot pain  Skin: Positive for rash and wound.  Physical Exam Updated Vital Signs BP (!) 158/96 (BP Location: Right Arm)   Pulse 94   Temp 98.6 F (37 C) (Oral)   Resp 16   SpO2 100%   Physical Exam Vitals signs and nursing note reviewed.  Constitutional:      General: She is not in acute distress.    Appearance: She is well-developed.  HENT:     Head: Normocephalic and atraumatic.  Eyes:     Conjunctiva/sclera: Conjunctivae normal.  Neck:     Musculoskeletal: Neck supple.  Cardiovascular:     Rate and Rhythm: Normal rate.  Pulmonary:     Effort: Pulmonary effort is normal.  Musculoskeletal: Normal range of motion.     Comments: Mild amount of swelling to the left foot.  She does have some areas of macerated skin and skin breakdown between several of the toes.  She  also has several areas of lichenification to the medial, lateral and plantar surface of the foot.  She does have some tenderness to these areas.  She does have some warmth to the dorsum of the foot.  There is no purulent drainage.  Pulses are intact.  Sensation is intact to all of the toes in all the toes have brisk cap refill.  Skin:    General: Skin is warm and dry.  Neurological:     Mental Status: She is alert.          ED Treatments / Results  Labs (all labs ordered are listed, but only abnormal results are displayed) Labs Reviewed - No data to display  EKG None  Radiology No results found.  Procedures Procedures (including critical care time)  Medications Ordered in ED Medications - No data to display   Initial Impression / Assessment and Plan / ED Course  I have reviewed the triage vital signs and the nursing notes.  Pertinent labs & imaging results that were available during my care of the patient were reviewed by me and considered in my medical decision making (see chart for details).     Final Clinical Impressions(s) / ED Diagnoses   Final diagnoses:  Tinea pedis of left foot  Lichenification and lichen simplex chronicus   Patient with history of athlete's foot and eczema.  On exam it does look like she still has evidence of athletes foot but she also has some areas of lichenification.  She does have some warmth and erythema to the dorsum of the foot. I will place her on clotrimazole for tinea pedis and also give her topical hydrocortisone for the areas of lichenification. Will also place on keflex for possible concurrent infection. Have given info for pcp f/u and advised on return precautions. She voices understanding and is in agreement with plan. All questions answered, pt stable for discharge.    ED Discharge Orders         Ordered    clotrimazole (LOTRIMIN) 1 % cream     02/02/19 1754    betamethasone dipropionate (DIPROLENE) 0.05 % ointment  2 times  daily     02/02/19 1754    cephALEXin (KEFLEX) 500 MG capsule  2 times daily     02/02/19 1754    naproxen (NAPROSYN) 500 MG tablet  2 times daily     02/02/19 1754           Sharren Schnurr, [employer], PA-C 02/02/19 1755    Veryl Speak, MD 02/03/19 [phone]

## 2019-02-02 NOTE — ED Triage Notes (Signed)
Pt c/o swelling, redness, and scales on left foot. Pt states its been getting worse for last week. Pt c/o yeast smell to left foot.

## 2019-02-02 NOTE — Discharge Instructions (Addendum)
Given new prescription for tramadol cream.  This is for treatment of athlete's foot.  He should only use this cream between the toes where you have active athlete's foot.  I have also given you a prescription for a steroid cream (betamethasone diproprionate).  Please use this on that middle and outside of your foot where you have the chronic rash.  I have also given you a prescription for an antibiotic to help with the potential skin infection.  Please take as directed.  Please follow up with your primary care provider within 5-7 days for re-evaluation of your symptoms. If you do not have a primary care provider, information for a healthcare clinic has been provided for you to make arrangements for follow up care.   Please return to the emergency room immediately if you experience any new or worsening symptoms or any symptoms that indicate worsening infection such as fevers, increased redness/swelling/pain, warmth, or drainage from the affected area.

## 2019-02-02 NOTE — ED Notes (Signed)
An After Visit Summary was printed and given to the patient. Discharge instructions given and no further questions at this time.  

## 2019-03-25 IMAGING — CT CT ABD-PELV W/ CM
2 of 5 series · 17 of 46 positions shown, 19 images · IV contrast (APPLIED)
Comparison: CT 08/09/2015

CLINICAL DATA: Abdominal pain, generalized abdominal pain.

EXAM:
CT ABDOMEN AND PELVIS WITH CONTRAST
TECHNIQUE: Multidetector CT imaging of the abdomen and pelvis was performed
using the standard protocol following bolus administration of
intravenous contrast.
CONTRAST:  125mL OMNIPAQUE IOHEXOL 300 MG/ML  SOLN

[Series 2: routine abd/pel with · axial · 0.98mm/px · z∈[-536,-106]mm · 14 of 96 slices shown, 16 images]
[im 5/96  soft-tissue]
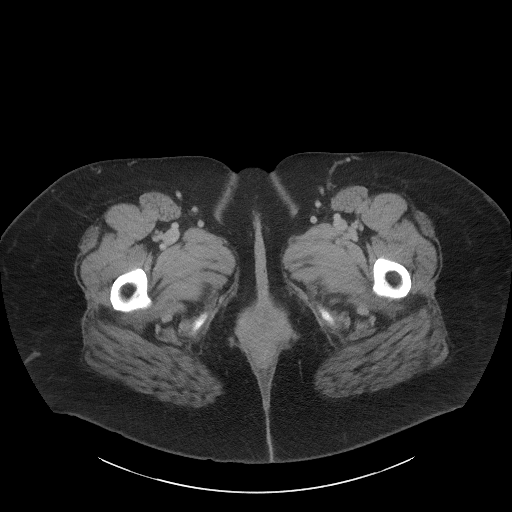
[im 5/96  bone]
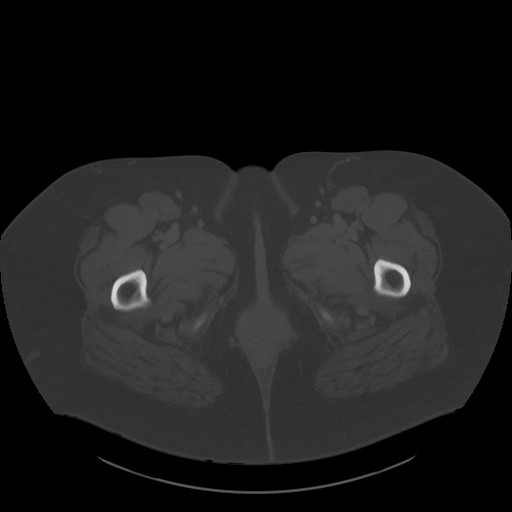
[im 13/96  soft-tissue]
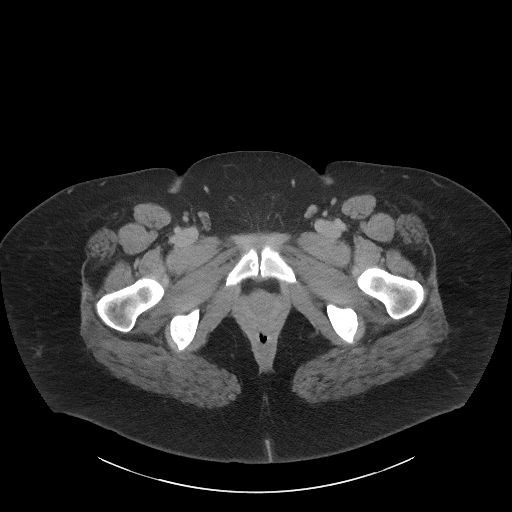
[im 18/96  soft-tissue]
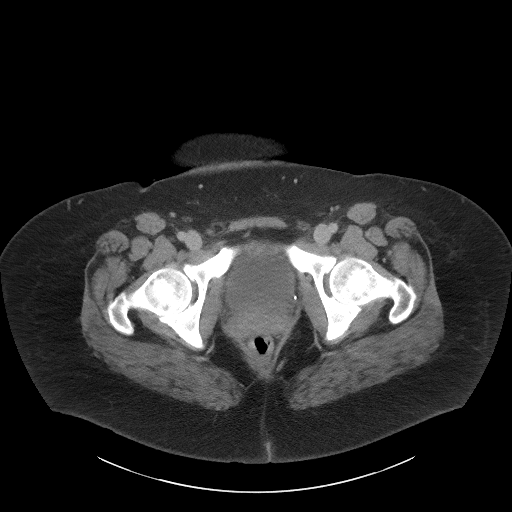
[im 26/96  soft-tissue]
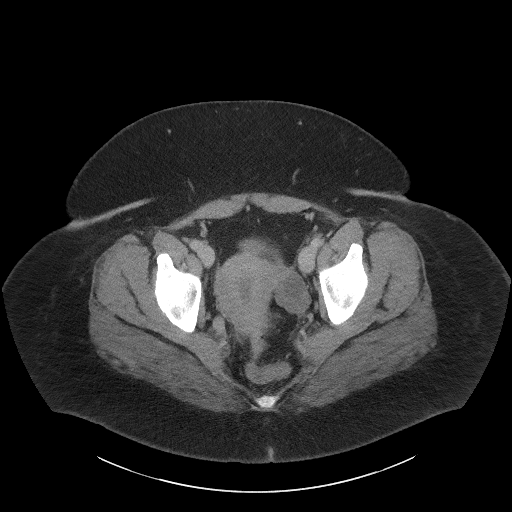
[im 31/96  soft-tissue]
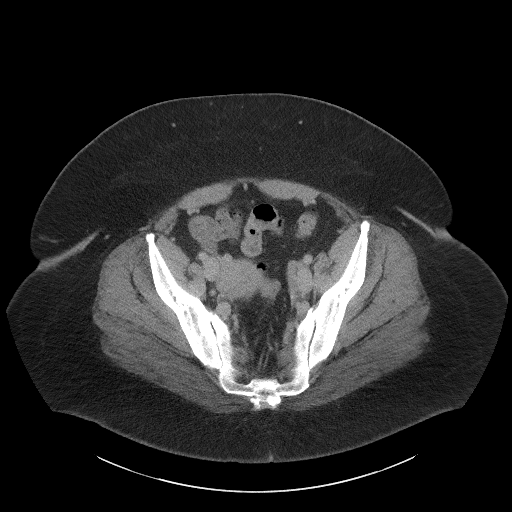
[im 39/96  soft-tissue]
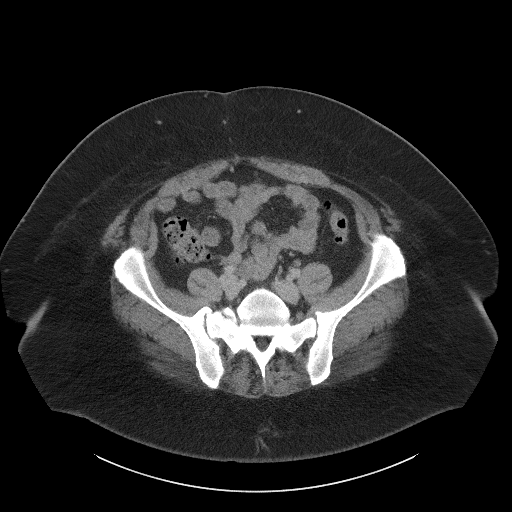
[im 44/96  soft-tissue]
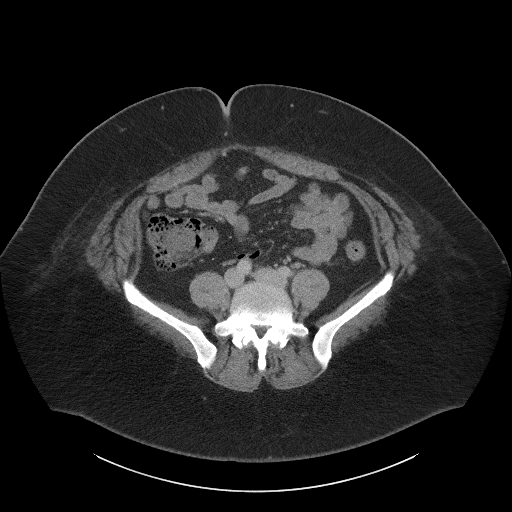
[im 52/96  soft-tissue]
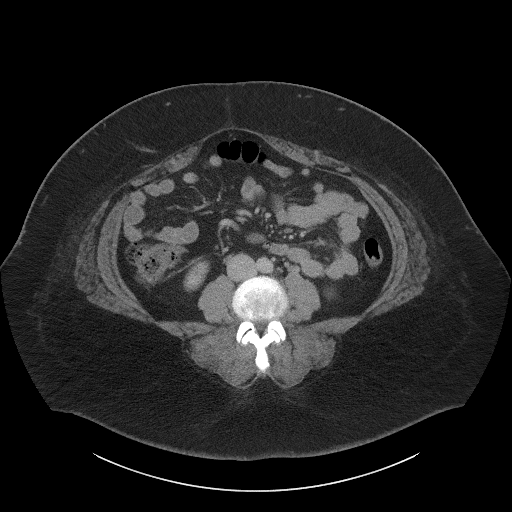
[im 57/96  soft-tissue]
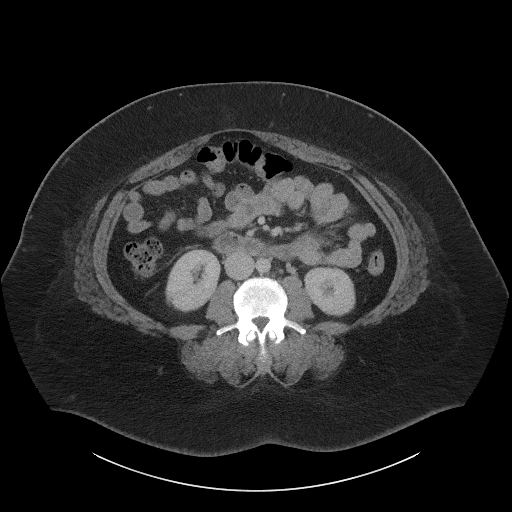
[im 57/96  bone]
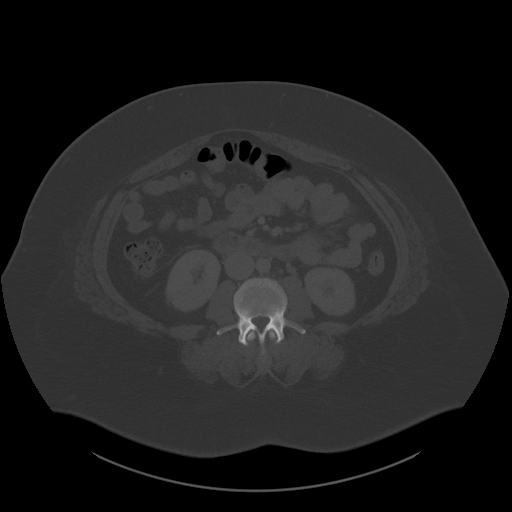
[im 65/96  soft-tissue]
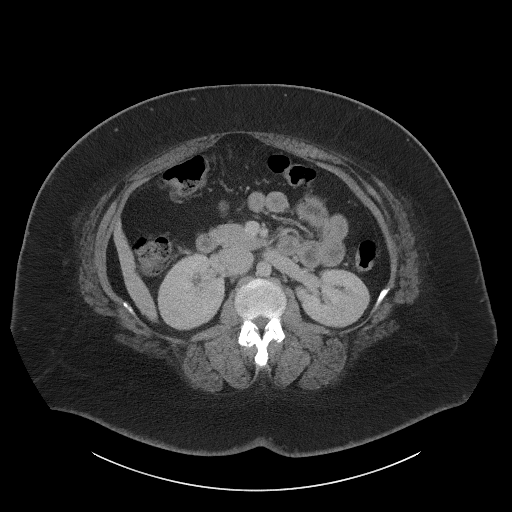
[im 70/96  soft-tissue]
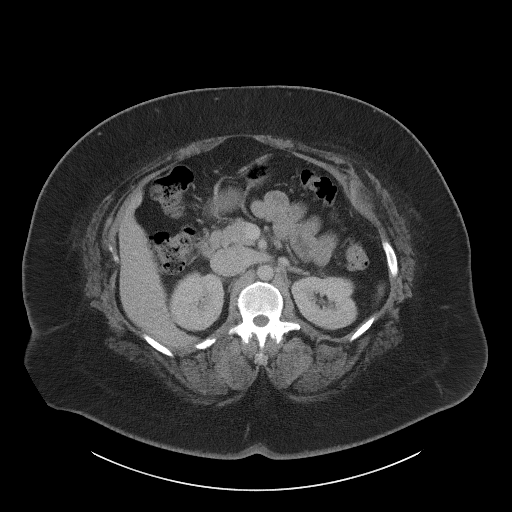
[im 78/96  soft-tissue]
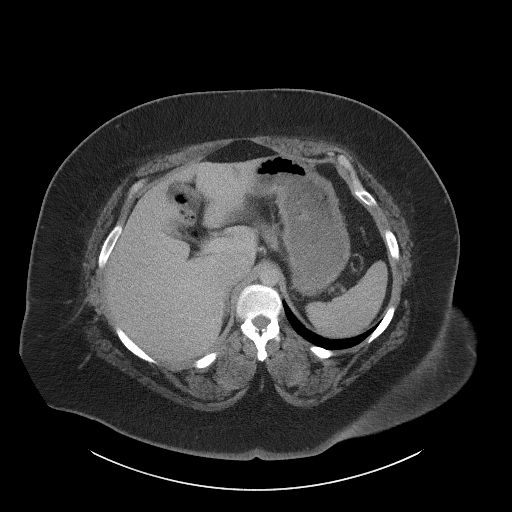
[im 83/96  soft-tissue]
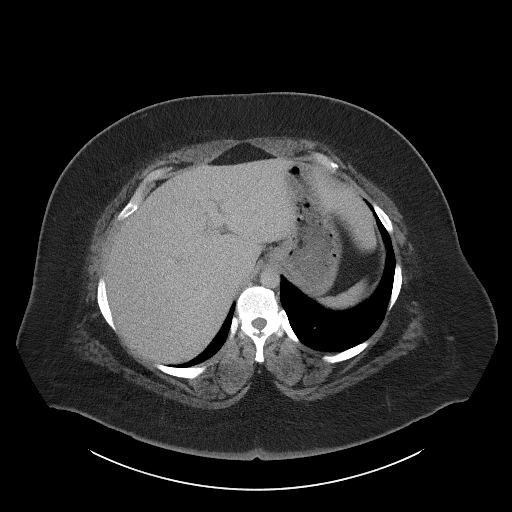
[im 91/96  soft-tissue]
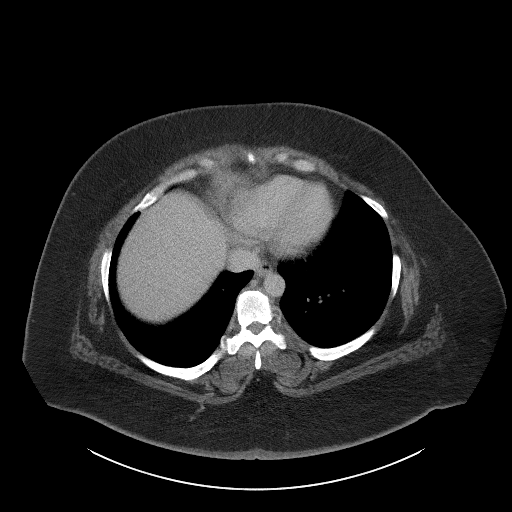

[Series 8: coronal st · coronal · 0.86mm/px · 3 of 114 slices shown]
[im 38/114  soft-tissue]
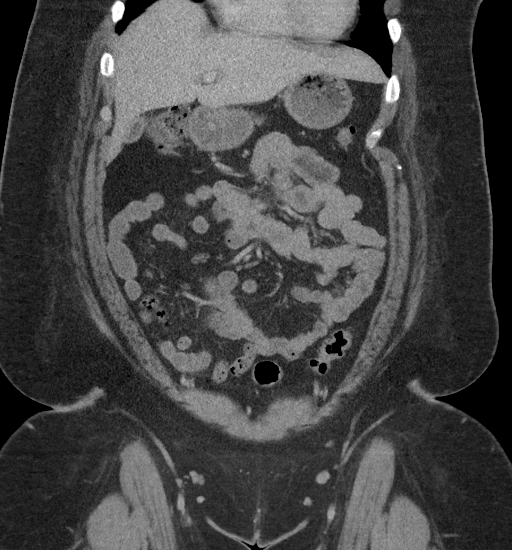
[im 51/114  soft-tissue]
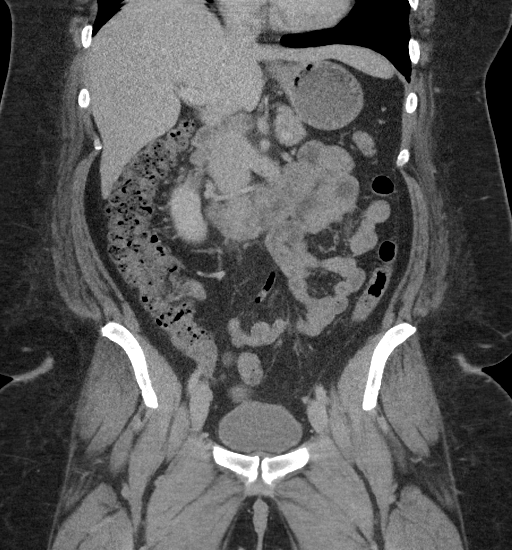
[im 63/114  soft-tissue]
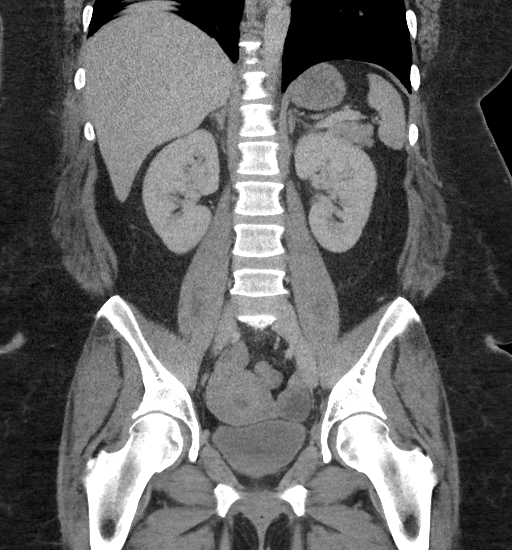

[17 of 46 positions shown; findings below may reference images not displayed]

FINDINGS: Lower chest: Lung bases are clear.

Hepatobiliary: No focal hepatic lesion. No biliary duct dilatation.
Gallbladder is normal. Common bile duct is normal.

Pancreas: Pancreas is normal. No ductal dilatation. No pancreatic
inflammation.

Spleen: Normal spleen

Adrenals/urinary tract: Adrenal glands and kidneys are normal. The
ureters and bladder normal.

Stomach/Bowel: Stomach, small bowel, appendix, and cecum are normal.
The colon and rectosigmoid colon are normal.

Vascular/Lymphatic: Abdominal aorta is normal caliber. No periportal
or retroperitoneal adenopathy. No pelvic adenopathy.

Reproductive: Uterus and ovaries normal.

Other: No free fluid.

Musculoskeletal: No aggressive osseous lesion.
IMPRESSION: 1. No acute abdominopelvic findings.
2. Normal appendix.
3. Normal uterus and ovaries.

## 2019-05-24 NOTE — Telephone Encounter (Signed)
done

## 2019-12-24 IMAGING — CR DG FOOT COMPLETE 3+V*L*
3 series · 3 of 3 positions shown · non-contrast
Comparison: None.

CLINICAL DATA: Plantar foot wound

EXAM:
LEFT FOOT - COMPLETE 3+ VIEW

[foot ap]
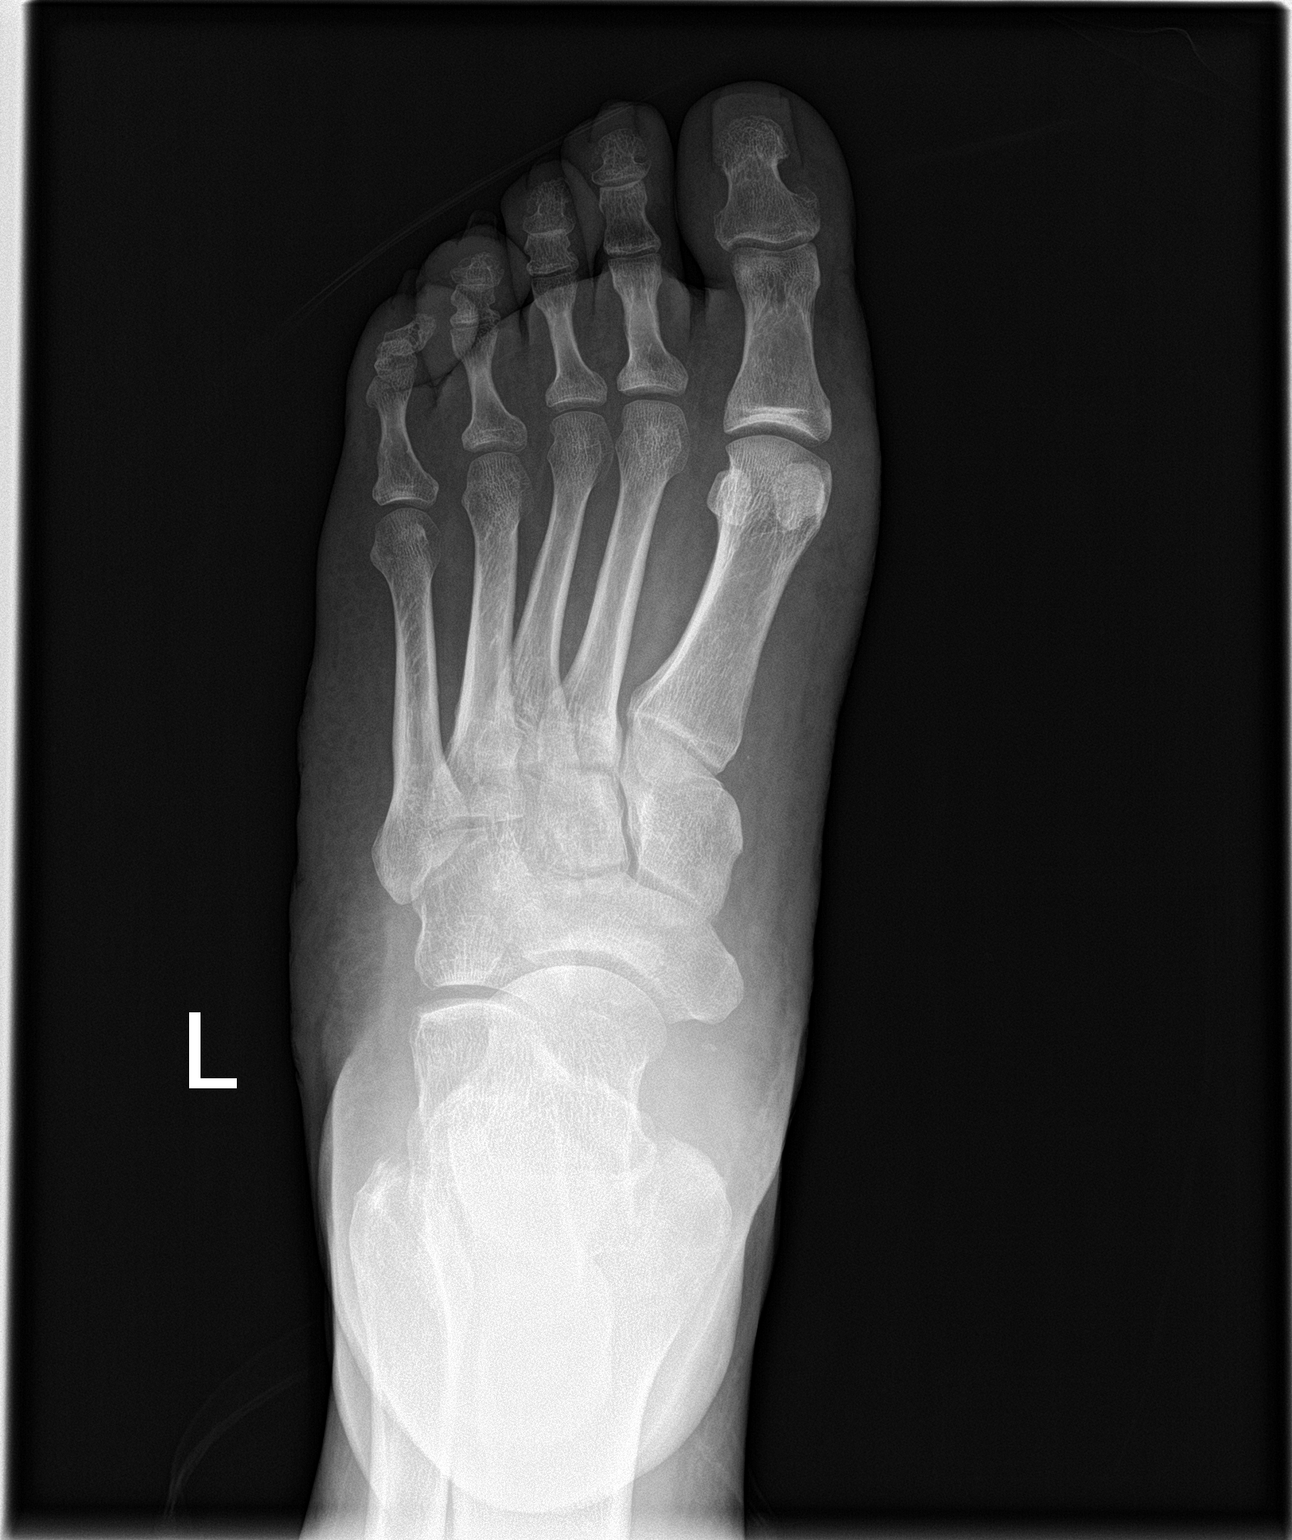

[foot obl]
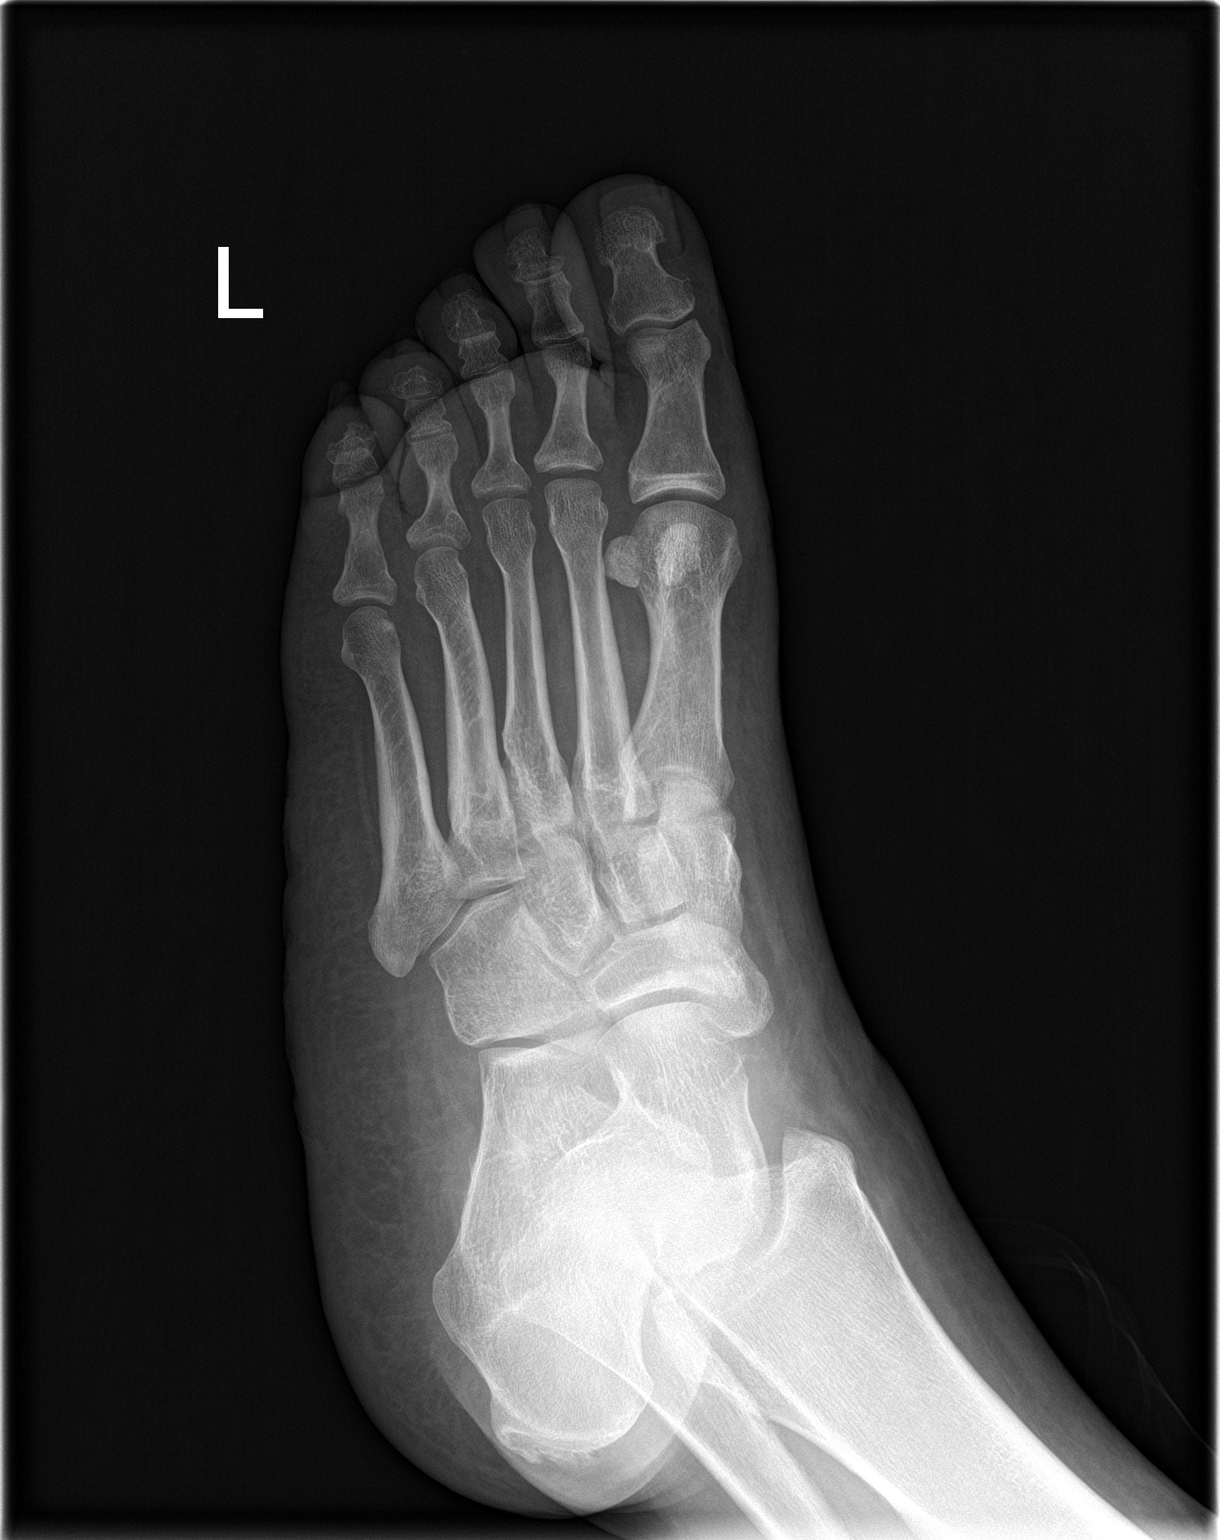

[foot lat]
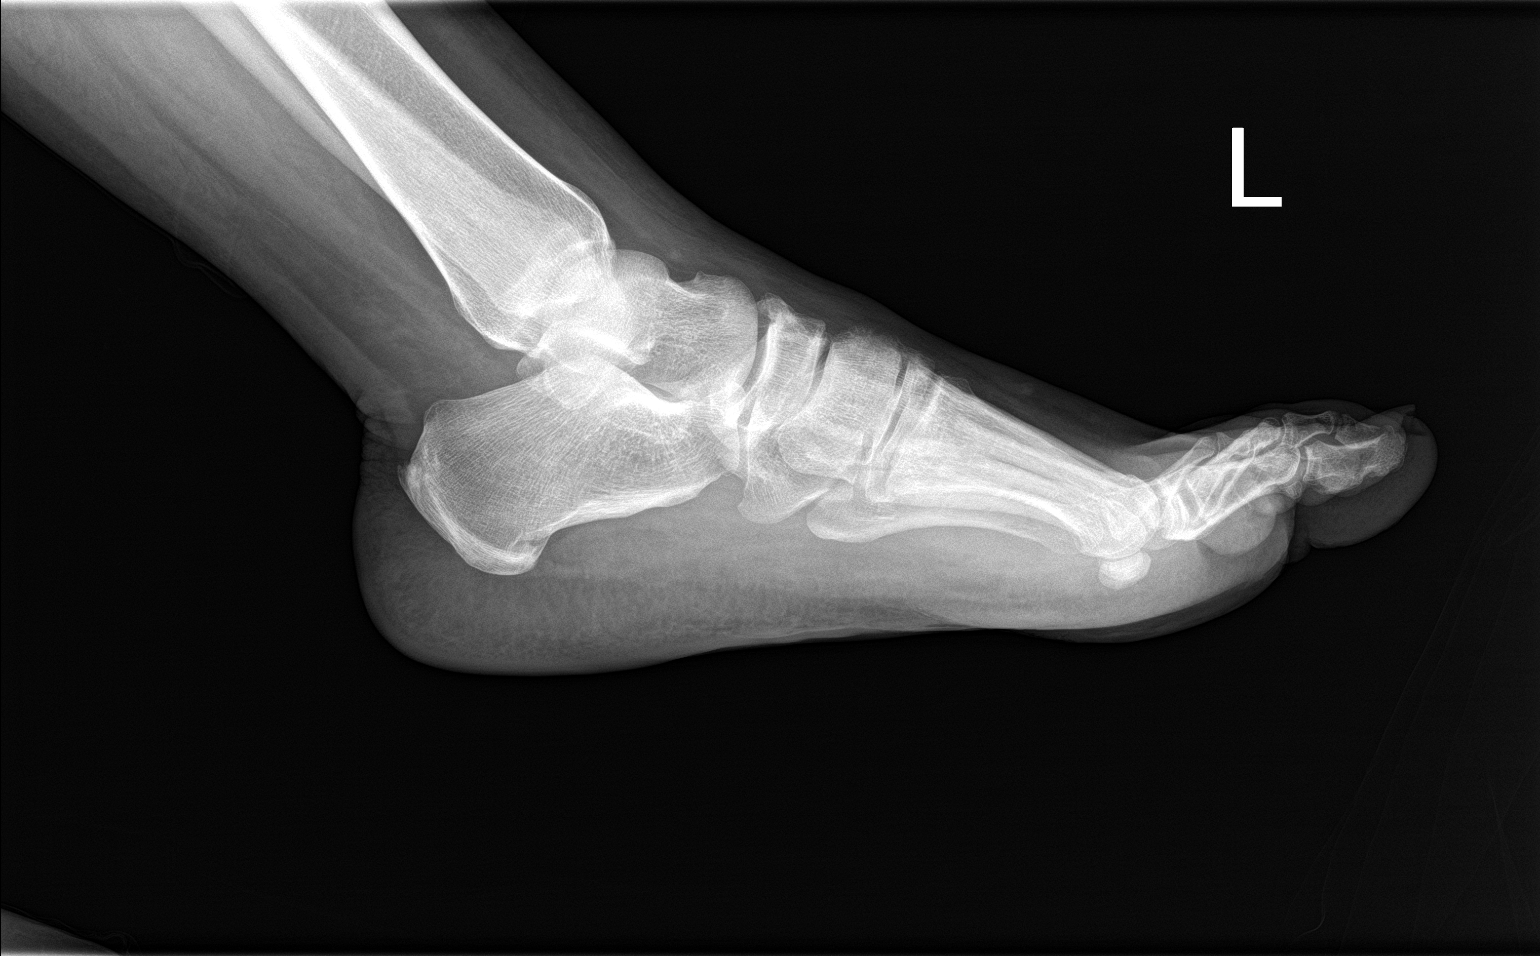

[3 of 3 positions shown; findings below may reference images not displayed]

FINDINGS: There is no evidence of fracture or dislocation. No cortical
destruction or periostitis. There is no evidence of arthropathy or
other focal bone abnormality. Soft tissue swelling most pronounced
over the plantar and lateral aspects of the foot. No soft tissue
gas. No radiopaque foreign body.
IMPRESSION: Soft tissue swelling without acute osseous abnormality.
